# Patient Record
Sex: Female | Born: 1970 | Hispanic: Yes | Marital: Married | Smoking: Never smoker
Health system: Southern US, Community
[De-identification: ages and names within clinical notes are randomized; demographics above are authoritative.]

## PROBLEM LIST (undated history)

## (undated) DIAGNOSIS — I10 Essential (primary) hypertension: Secondary | ICD-10-CM

## (undated) DIAGNOSIS — M1990 Unspecified osteoarthritis, unspecified site: Secondary | ICD-10-CM

## (undated) HISTORY — PX: OTHER SURGICAL HISTORY: SHX169

---

## 2006-08-27 ENCOUNTER — Ambulatory Visit: Payer: Self-pay

## 2006-10-13 ENCOUNTER — Emergency Department: Payer: Self-pay | Admitting: Emergency Medicine

## 2007-02-17 ENCOUNTER — Ambulatory Visit: Payer: Self-pay | Admitting: Urology

## 2009-10-14 ENCOUNTER — Emergency Department: Payer: Self-pay | Admitting: Emergency Medicine

## 2009-10-16 ENCOUNTER — Emergency Department: Payer: Self-pay | Admitting: Internal Medicine

## 2009-12-25 ENCOUNTER — Emergency Department: Payer: Self-pay | Admitting: Unknown Physician Specialty

## 2011-08-30 ENCOUNTER — Emergency Department: Payer: Self-pay | Admitting: Unknown Physician Specialty

## 2011-08-30 LAB — COMPREHENSIVE METABOLIC PANEL
Albumin: 3.6 g/dL (ref 3.4–5.0)
Alkaline Phosphatase: 47 U/L — ABNORMAL LOW (ref 50–136)
Anion Gap: 10 (ref 7–16)
BUN: 11 mg/dL (ref 7–18)
Glucose: 113 mg/dL — ABNORMAL HIGH (ref 65–99)
Osmolality: 285 (ref 275–301)
Potassium: 3.8 mmol/L (ref 3.5–5.1)
SGOT(AST): 19 U/L (ref 15–37)
SGPT (ALT): 18 U/L
Sodium: 143 mmol/L (ref 136–145)
Total Protein: 7.6 g/dL (ref 6.4–8.2)

## 2011-08-30 LAB — CBC
HCT: 36 % (ref 35.0–47.0)
HGB: 12.2 g/dL (ref 12.0–16.0)
MCV: 93 fL (ref 80–100)
RDW: 12.8 % (ref 11.5–14.5)
WBC: 8 10*3/uL (ref 3.6–11.0)

## 2011-08-30 LAB — URINALYSIS, COMPLETE
Bacteria: NONE SEEN
Bilirubin,UR: NEGATIVE
Blood: NEGATIVE
Glucose,UR: NEGATIVE mg/dL (ref 0–75)
Ketone: NEGATIVE
Leukocyte Esterase: NEGATIVE
Nitrite: NEGATIVE
RBC,UR: NONE SEEN /HPF (ref 0–5)
Specific Gravity: 1.002 (ref 1.003–1.030)
WBC UR: <1 /HPF (ref 0–5)

## 2011-08-30 LAB — PREGNANCY, URINE: Pregnancy Test, Urine: NEGATIVE m[IU]/mL

## 2012-04-21 ENCOUNTER — Emergency Department: Payer: Self-pay | Admitting: Emergency Medicine

## 2012-04-21 LAB — COMPREHENSIVE METABOLIC PANEL
Alkaline Phosphatase: 56 U/L (ref 50–136)
BUN: 8 mg/dL (ref 7–18)
Bilirubin,Total: 0.2 mg/dL (ref 0.2–1.0)
Calcium, Total: 8.4 mg/dL — ABNORMAL LOW (ref 8.5–10.1)
Creatinine: 0.38 mg/dL — ABNORMAL LOW (ref 0.60–1.30)
EGFR (African American): >60
Glucose: 80 mg/dL (ref 65–99)
Potassium: 3.5 mmol/L (ref 3.5–5.1)
SGOT(AST): 16 U/L (ref 15–37)
SGPT (ALT): 16 U/L (ref 12–78)
Sodium: 140 mmol/L (ref 136–145)
Total Protein: 7.5 g/dL (ref 6.4–8.2)

## 2012-04-21 LAB — CBC
HCT: 34.5 % — ABNORMAL LOW (ref 35.0–47.0)
MCH: 32.2 pg (ref 26.0–34.0)
MCV: 95 fL (ref 80–100)
RDW: 13.1 % (ref 11.5–14.5)
WBC: 9 10*3/uL (ref 3.6–11.0)

## 2012-04-21 LAB — URINALYSIS, COMPLETE
Bacteria: NONE SEEN
Glucose,UR: NEGATIVE mg/dL (ref 0–75)
Leukocyte Esterase: NEGATIVE
Nitrite: NEGATIVE
Protein: NEGATIVE
RBC,UR: <1 /HPF (ref 0–5)
Specific Gravity: 1.006 (ref 1.003–1.030)
WBC UR: NONE SEEN /HPF (ref 0–5)

## 2012-11-26 DIAGNOSIS — R32 Unspecified urinary incontinence: Secondary | ICD-10-CM | POA: Insufficient documentation

## 2012-11-26 DIAGNOSIS — N200 Calculus of kidney: Secondary | ICD-10-CM | POA: Insufficient documentation

## 2012-11-26 DIAGNOSIS — Z87442 Personal history of urinary calculi: Secondary | ICD-10-CM | POA: Insufficient documentation

## 2012-12-10 DIAGNOSIS — R3915 Urgency of urination: Secondary | ICD-10-CM | POA: Insufficient documentation

## 2014-05-25 ENCOUNTER — Emergency Department: Payer: Self-pay | Admitting: Emergency Medicine

## 2015-10-29 ENCOUNTER — Encounter: Payer: Self-pay | Admitting: Emergency Medicine

## 2015-10-29 ENCOUNTER — Emergency Department
Admission: EM | Admit: 2015-10-29 | Discharge: 2015-10-29 | Disposition: A | Discharge status: Home / Self Care | Payer: No Typology Code available for payment source | Attending: Emergency Medicine | Admitting: Emergency Medicine

## 2015-10-29 DIAGNOSIS — Z3202 Encounter for pregnancy test, result negative: Secondary | ICD-10-CM | POA: Insufficient documentation

## 2015-10-29 DIAGNOSIS — R197 Diarrhea, unspecified: Secondary | ICD-10-CM | POA: Diagnosis present

## 2015-10-29 DIAGNOSIS — K529 Noninfective gastroenteritis and colitis, unspecified: Secondary | ICD-10-CM | POA: Insufficient documentation

## 2015-10-29 LAB — URINALYSIS COMPLETE WITH MICROSCOPIC (ARMC ONLY)
Bilirubin Urine: NEGATIVE
GLUCOSE, UA: NEGATIVE mg/dL
LEUKOCYTES UA: NEGATIVE
NITRITE: NEGATIVE
Protein, ur: 30 mg/dL — AB
SPECIFIC GRAVITY, URINE: 1.024 (ref 1.005–1.030)
pH: 5 (ref 5.0–8.0)

## 2015-10-29 LAB — COMPREHENSIVE METABOLIC PANEL
ALK PHOS: 67 U/L (ref 38–126)
ALT: 42 U/L (ref 14–54)
ANION GAP: 9 (ref 5–15)
AST: 40 U/L (ref 15–41)
Albumin: 4.3 g/dL (ref 3.5–5.0)
BILIRUBIN TOTAL: 0.7 mg/dL (ref 0.3–1.2)
BUN: 19 mg/dL (ref 6–20)
CALCIUM: 8.7 mg/dL — AB (ref 8.9–10.3)
CO2: 20 mmol/L — ABNORMAL LOW (ref 22–32)
Chloride: 105 mmol/L (ref 101–111)
Creatinine, Ser: 0.47 mg/dL (ref 0.44–1.00)
GFR calc Af Amer: >60 mL/min (ref >60)
Glucose, Bld: 140 mg/dL — ABNORMAL HIGH (ref 65–99)
POTASSIUM: 3.1 mmol/L — AB (ref 3.5–5.1)
Sodium: 134 mmol/L — ABNORMAL LOW (ref 135–145)
TOTAL PROTEIN: 7.8 g/dL (ref 6.5–8.1)

## 2015-10-29 LAB — LIPASE, BLOOD: LIPASE: 16 U/L (ref 11–51)

## 2015-10-29 LAB — CBC
HEMATOCRIT: 37.3 % (ref 35.0–47.0)
HEMOGLOBIN: 12.6 g/dL (ref 12.0–16.0)
MCH: 31.1 pg (ref 26.0–34.0)
MCHC: 33.7 g/dL (ref 32.0–36.0)
MCV: 92.5 fL (ref 80.0–100.0)
Platelets: 200 10*3/uL (ref 150–440)
RBC: 4.03 MIL/uL (ref 3.80–5.20)
RDW: 12.9 % (ref 11.5–14.5)
WBC: 9.8 10*3/uL (ref 3.6–11.0)

## 2015-10-29 LAB — POCT PREGNANCY, URINE: PREG TEST UR: NEGATIVE

## 2015-10-29 MED ORDER — SODIUM CHLORIDE 0.9 % IV BOLUS (SEPSIS)
1000.0000 mL | Freq: Once | INTRAVENOUS | Status: AC
  Administered 2015-10-29: 1000 mL via INTRAVENOUS

## 2015-10-29 MED ORDER — PROMETHAZINE HCL 12.5 MG PO TABS: 12.5000 mg | ORAL_TABLET | Freq: Four times a day (QID) | ORAL | Status: AC | PRN

## 2015-10-29 MED ORDER — PROMETHAZINE HCL 25 MG/ML IJ SOLN
12.5000 mg | Freq: Four times a day (QID) | INTRAMUSCULAR | Status: DC | PRN
  Administered 2015-10-29: 12.5 mg via INTRAVENOUS
  Filled 2015-10-29: qty 1

## 2015-10-29 MED ORDER — POTASSIUM CHLORIDE CRYS ER 20 MEQ PO TBCR
40.0000 meq | EXTENDED_RELEASE_TABLET | Freq: Once | ORAL | Status: AC
  Administered 2015-10-29: 40 meq via ORAL
  Filled 2015-10-29: qty 2

## 2015-10-29 NOTE — ED Provider Notes (Addendum)
[facility] Emergency Department Provider Note  ____________________________________________   I have reviewed the triage vital signs and the nursing notes.   HISTORY  Chief Complaint Abdominal Pain; Emesis; and Diarrhea    HPI Donna Villarreal is a 45 y.o. female who is largely healthy. States that she has had nausea vomiting and diarrhea since yesterday. No fever no chills. No shortness of breath. No hematemesis no red blood per rectum. Patient states she has had more watery diarrheas and she can. No recent antibiotics. No recent travel. Her son has the same thing.  History reviewed. No pertinent past medical history.  There are no active problems to display for this patient.   Past Surgical History  Procedure Laterality Date  . Cesarean section      No current outpatient prescriptions on file.  Allergies Review of patient's allergies indicates no known allergies.  History reviewed. No pertinent family history.  Social History Social History  Substance Use Topics  . Smoking status: Never Smoker   . Smokeless tobacco: None  . Alcohol Use: No    Review of Systems Constitutional: No fever/chills Eyes: No visual changes. ENT: No sore throat. No stiff neck no neck pain Cardiovascular: Denies chest pain. Respiratory: Denies shortness of breath. Gastrointestinal:   See history of present illness Genitourinary: Negative for dysuria. Musculoskeletal: Negative lower extremity swelling Skin: Negative for rash. Neurological: Negative for headaches, focal weakness or numbness. 10-point ROS otherwise negative.  ____________________________________________   PHYSICAL EXAM:  VITAL SIGNS: ED Triage Vitals  Enc Vitals Group     BP 10/29/15 0019 108/73 mmHg     Pulse Rate 10/29/15 0019 97     Resp 10/29/15 0019 20     Temp 10/29/15 0341 98.3 F (36.8 C)     Temp Source 10/29/15 0341 Oral     SpO2 10/29/15 0019 99 %   Weight 10/29/15 0019 149 lb (67.586 kg)     Height 10/29/15 0019 5' (1.524 m)     Head Cir --      Peak Flow --      Pain Score 10/29/15 0021 6     Pain Loc --      Pain Edu? --      Excl. in GC? --     Constitutional: Alert and oriented. Well appearing and in no acute distress. Eyes: Conjunctivae are normal. PERRL. EOMI. Head: Atraumatic. Nose: No congestion/rhinnorhea. Mouth/Throat: Mucous membranes are moist.  Oropharynx non-erythematous. Neck: No stridor.   Nontender with no meningismus Cardiovascular: Normal rate, regular rhythm. Grossly normal heart sounds.  Good peripheral circulation. Respiratory: Normal respiratory effort.  No retractions. Lungs CTAB. Abdominal: Soft and nontender. No distention. No guarding no rebound Back:  There is no focal tenderness or step off there is no midline tenderness there are no lesions noted. there is no CVA tenderness Musculoskeletal: No lower extremity tenderness. No joint effusions, no DVT signs strong distal pulses no edema Neurologic:  Normal speech and language. No gross focal neurologic deficits are appreciated.  Skin:  Skin is warm, dry and intact. No rash noted. Psychiatric: Mood and affect are normal. Speech and behavior are normal.  ____________________________________________   LABS (all labs ordered are listed, but only abnormal results are displayed)  Labs Reviewed  COMPREHENSIVE METABOLIC PANEL - Abnormal; Notable for the following:    Sodium 134 (*)    Potassium 3.1 (*)    CO2 20 (*)    Glucose, Bld 140 (*)  Calcium 8.7 (*)    All other components within normal limits  URINALYSIS COMPLETEWITH MICROSCOPIC ([facility] ONLY) - Abnormal; Notable for the following:    Color, Urine YELLOW (*)    APPearance CLEAR (*)    Ketones, ur TRACE (*)    Hgb urine dipstick 3+ (*)    Protein, ur 30 (*)    Bacteria, UA RARE (*)    Squamous Epithelial / LPF 0-5 (*)    All other components within normal limits  LIPASE, BLOOD  CBC  POC  URINE PREG, ED  POCT PREGNANCY, URINE   ____________________________________________  EKG  I personally interpreted any EKGs ordered by me or triage  ____________________________________________  RADIOLOGY  I reviewed any imaging ordered by me or triage that were performed during my shift and, if possible, patient and/or family made aware of any abnormal findings. ____________________________________________   PROCEDURES  Procedure(s) performed: None  Critical Care performed: None  ____________________________________________   INITIAL IMPRESSION / ASSESSMENT AND PLAN / ED COURSE  Pertinent labs & imaging results that were available during my care of the patient were reviewed by me and considered in my medical decision making (see chart for details).  Patient with likely gastroenteritis. Abdomen is benign. We will replete her potassium blood work is otherwise largely reassuring. Serial abdominal exams show no evidence of surgical pathology. We'll try by mouth challenge.  ----------------------------------------- 6:19 AM on 10/29/2015 -----------------------------------------  Patient states she feels much better serial abdominal exams are completely benign, patient is eager to go home. Follow-up with PCP as advised. Return precautions given and understood. ____________________________________________   FINAL CLINICAL IMPRESSION(S) / ED DIAGNOSES  Final diagnoses:  None      This chart was dictated using voice recognition software.  Despite best efforts to proofread,  errors can occur which can change meaning.     Jeanmarie PlantJames A Nanami Whitelaw, MD 10/29/15 [id number]  Jeanmarie PlantJames A Mearle Drew, MD 10/29/15 [phone]

## 2015-10-29 NOTE — ED Notes (Signed)
Waiting for interpreter to assess pt; currently busy with another pt in dept

## 2015-10-29 NOTE — ED Notes (Signed)
Interpreter present

## 2015-10-29 NOTE — ED Notes (Signed)
Patient reports have symptoms of a virus for approximately 1 month.  Around 1pm Saturday started with abdominal pain, nausea, vomiting and diarreha. Also reports short of breath.  Patient with skin warm, pale in color.  Respirations even and unlabored, no acute respiratory distress noted.

## 2016-06-17 ENCOUNTER — Ambulatory Visit: Payer: Managed Care, Other (non HMO) | Attending: Oncology | Admitting: *Deleted

## 2016-06-17 ENCOUNTER — Ambulatory Visit
Admission: RE | Admit: 2016-06-17 | Discharge: 2016-06-17 | Disposition: A | Discharge status: Home / Self Care | Payer: Managed Care, Other (non HMO) | Source: Ambulatory Visit | Attending: Oncology | Admitting: Oncology

## 2016-06-17 VITALS — BP 115/78 | HR 68 | Temp 98.2°F | Ht 59.45 in | Wt 140.1 lb

## 2016-06-17 DIAGNOSIS — Z0000 Encounter for general adult medical examination without abnormal findings: Secondary | ICD-10-CM

## 2016-06-17 NOTE — Patient Instructions (Signed)
Gave patient hand-out, Women Staying Healthy, Active and Well from [facility], with education on breast health, pap smears, heart and colon health. 

## 2016-06-17 NOTE — Progress Notes (Signed)
Subjective:     Patient ID: Donna Villarreal, female   DOB: October 26, 1970, 45 y.o.   MRN: [id number]  HPI   Review of Systems     Objective:   Physical Exam  Pulmonary/Chest: Right breast exhibits no inverted nipple, no mass, no nipple discharge, no skin change and no tenderness. Left breast exhibits no inverted nipple, no mass, no nipple discharge, no skin change and no tenderness. Breasts are symmetrical.       Assessment:     45 year old Hispanic [facility]  Lloyda, the interpreter present during the interview and exam.  Clinical breast exam unremarkable.  Taught self breast awareness.  Patient states she has a little "ball" in the upper left abdomen.  When she stands up there is a small approximate 1 cm smooth nodule in the left upper quadrant near the rib cage.  Encouraged her to discuss with [facility].  She is agreeable.    Plan:     Screening mammogram ordered.  Will follow-up per [facility] protocol.

## 2016-06-18 ENCOUNTER — Encounter: Payer: Self-pay | Admitting: *Deleted

## 2016-06-18 NOTE — Progress Notes (Signed)
Letter mailed to inform patient of her normal mammogram.  She is to follow-up in one year with annual screening.  HSIS to Christy. 

## 2017-08-29 ENCOUNTER — Other Ambulatory Visit: Payer: Self-pay | Admitting: Physician Assistant

## 2017-08-29 DIAGNOSIS — R1032 Left lower quadrant pain: Secondary | ICD-10-CM

## 2017-09-09 ENCOUNTER — Ambulatory Visit
Admission: RE | Admit: 2017-09-09 | Discharge: 2017-09-09 | Disposition: A | Discharge status: Home / Self Care | Payer: Managed Care, Other (non HMO) | Source: Ambulatory Visit | Attending: Physician Assistant | Admitting: Physician Assistant

## 2017-09-11 ENCOUNTER — Ambulatory Visit
Admission: RE | Admit: 2017-09-11 | Discharge: 2017-09-11 | Disposition: A | Discharge status: Home / Self Care | Payer: 59 | Source: Ambulatory Visit | Attending: Physician Assistant | Admitting: Physician Assistant

## 2017-09-11 DIAGNOSIS — G8929 Other chronic pain: Secondary | ICD-10-CM | POA: Diagnosis not present

## 2017-09-11 DIAGNOSIS — R1032 Left lower quadrant pain: Secondary | ICD-10-CM | POA: Insufficient documentation

## 2017-09-11 DIAGNOSIS — N200 Calculus of kidney: Secondary | ICD-10-CM | POA: Insufficient documentation

## 2017-09-16 ENCOUNTER — Encounter: Payer: Self-pay | Admitting: Emergency Medicine

## 2017-09-16 ENCOUNTER — Emergency Department
Admission: EM | Admit: 2017-09-16 | Discharge: 2017-09-16 | Disposition: A | Discharge status: Home / Self Care | Payer: 59 | Attending: Emergency Medicine | Admitting: Emergency Medicine

## 2017-09-16 ENCOUNTER — Other Ambulatory Visit: Payer: Self-pay

## 2017-09-16 DIAGNOSIS — R109 Unspecified abdominal pain: Secondary | ICD-10-CM

## 2017-09-16 DIAGNOSIS — R1012 Left upper quadrant pain: Secondary | ICD-10-CM | POA: Insufficient documentation

## 2017-09-16 DIAGNOSIS — R112 Nausea with vomiting, unspecified: Secondary | ICD-10-CM | POA: Insufficient documentation

## 2017-09-16 LAB — COMPREHENSIVE METABOLIC PANEL
ALK PHOS: 57 U/L (ref 38–126)
ALT: 20 U/L (ref 14–54)
AST: 21 U/L (ref 15–41)
Albumin: 4 g/dL (ref 3.5–5.0)
Anion gap: 10 (ref 5–15)
BILIRUBIN TOTAL: 0.5 mg/dL (ref 0.3–1.2)
BUN: 12 mg/dL (ref 6–20)
CALCIUM: 9.3 mg/dL (ref 8.9–10.3)
CHLORIDE: 102 mmol/L (ref 101–111)
CO2: 26 mmol/L (ref 22–32)
CREATININE: 0.4 mg/dL — AB (ref 0.44–1.00)
Glucose, Bld: 91 mg/dL (ref 65–99)
Potassium: 3.5 mmol/L (ref 3.5–5.1)
Sodium: 138 mmol/L (ref 135–145)
Total Protein: 7.6 g/dL (ref 6.5–8.1)

## 2017-09-16 LAB — CBC
HCT: 33.1 % — ABNORMAL LOW (ref 35.0–47.0)
Hemoglobin: 11.5 g/dL — ABNORMAL LOW (ref 12.0–16.0)
MCH: 31.4 pg (ref 26.0–34.0)
MCHC: 34.6 g/dL (ref 32.0–36.0)
MCV: 90.8 fL (ref 80.0–100.0)
PLATELETS: 179 10*3/uL (ref 150–440)
RBC: 3.65 MIL/uL — ABNORMAL LOW (ref 3.80–5.20)
RDW: 13 % (ref 11.5–14.5)
WBC: 10.3 10*3/uL (ref 3.6–11.0)

## 2017-09-16 LAB — URINALYSIS, COMPLETE (UACMP) WITH MICROSCOPIC
Bacteria, UA: NONE SEEN
Bilirubin Urine: NEGATIVE
GLUCOSE, UA: NEGATIVE mg/dL
KETONES UR: NEGATIVE mg/dL
LEUKOCYTES UA: NEGATIVE
Nitrite: NEGATIVE
PH: 7 (ref 5.0–8.0)
Protein, ur: NEGATIVE mg/dL
Specific Gravity, Urine: 1.004 — ABNORMAL LOW (ref 1.005–1.030)

## 2017-09-16 LAB — LIPASE, BLOOD: LIPASE: 26 U/L (ref 11–51)

## 2017-09-16 MED ORDER — ONDANSETRON 4 MG PO TBDP: 4.0000 mg | ORAL_TABLET | Freq: Three times a day (TID) | ORAL | 0 refills | Status: DC | PRN

## 2017-09-16 MED ORDER — DICYCLOMINE HCL 20 MG PO TABS: 20.0000 mg | ORAL_TABLET | Freq: Three times a day (TID) | ORAL | 0 refills | Status: DC | PRN

## 2017-09-16 MED ORDER — GI COCKTAIL ~~LOC~~
30.0000 mL | Freq: Once | ORAL | Status: AC
  Administered 2017-09-16: 30 mL via ORAL
  Filled 2017-09-16: qty 30

## 2017-09-16 MED ORDER — DICYCLOMINE HCL 10 MG PO CAPS
20.0000 mg | ORAL_CAPSULE | Freq: Once | ORAL | Status: AC
  Administered 2017-09-16: 20 mg via ORAL
  Filled 2017-09-16: qty 2

## 2017-09-16 MED ORDER — ONDANSETRON 4 MG PO TBDP
ORAL_TABLET | ORAL | Status: AC
  Filled 2017-09-16: qty 1

## 2017-09-16 MED ORDER — ONDANSETRON 4 MG PO TBDP
4.0000 mg | ORAL_TABLET | Freq: Once | ORAL | Status: AC
  Administered 2017-09-16: 4 mg via ORAL

## 2017-09-16 NOTE — ED Notes (Signed)
First nurse note  [facility]   Pain started 2 months ago  Pain increased over the past 2 days

## 2017-09-16 NOTE — ED Provider Notes (Signed)
[facility] Emergency Department Provider Note  Time seen: 5:48 PM  I have reviewed the triage vital signs and the nursing notes.   HISTORY  Chief Complaint Abdominal Pain    HPI Donna Villarreal is a 47 y.o. female who presents to the emergency department for left upper quadrant pain times 2 months.  According to the patient for the past 2 months she has been expensing left upper quadrant pain, significantly worse over the past 24 hours.  States nausea with occasional episodes of vomiting, had one episode of vomiting with blood streaking in it.  Denies any diarrhea, black or bloody stool.  Denies dysuria or hematuria.  No known fever.  Patient states she is seen her doctor for the same, had a CT scan ordered several days ago but has not heard the results yet.  Describes her pain currently is moderate to severe in the left upper quadrant burning in quality.   History reviewed. No pertinent past medical history.  There are no active problems to display for this patient.   Past Surgical History:  Procedure Laterality Date  . CESAREAN SECTION      Prior to Admission medications   Medication Sig Start Date End Date Taking? Authorizing Provider  promethazine (PHENERGAN) 12.5 MG tablet Take 1 tablet (12.5 mg total) by mouth every 6 (six) hours as needed for nausea or vomiting. 10/29/15   Jeanmarie PlantMcShane, James A, MD    No Known Allergies  No family history on file.  Social History Social History   Tobacco Use  . Smoking status: Never Smoker  . Smokeless tobacco: Never Used  Substance Use Topics  . Alcohol use: No  . Drug use: Not on file    Review of Systems Constitutional: Negative for fever. Eyes: Negative for visual complaints ENT: Negative for recent illness/congestion Cardiovascular: Negative for chest pain. Respiratory: Negative for shortness of breath. Gastrointestinal: Left mid and upper abdominal pain, burning.  Nausea and vomiting.   Negative for diarrhea. Genitourinary: Negative for hematuria or dysuria. Musculoskeletal: Negative for musculoskeletal complaints Skin: Negative for skin complaints  Neurological: Negative for headache All other ROS negative  ____________________________________________   PHYSICAL EXAM:  VITAL SIGNS: ED Triage Vitals  Enc Vitals Group     BP 09/16/17 1534 133/84     Pulse Rate 09/16/17 1534 77     Resp 09/16/17 1534 17     Temp 09/16/17 1534 97.9 F (36.6 C)     Temp Source 09/16/17 1534 Oral     SpO2 09/16/17 1534 100 %     Weight 09/16/17 1535 145 lb (65.8 kg)     Height 09/16/17 1535 5\' 2"  (1.575 m)     Head Circumference --      Peak Flow --      Pain Score 09/16/17 1534 5     Pain Loc --      Pain Edu? --      Excl. in GC? --    Constitutional: Alert and oriented. Well appearing and in no distress. Eyes: Normal exam ENT   Head: Normocephalic and atraumatic.   Mouth/Throat: Mucous membranes are moist. Cardiovascular: Normal rate, regular rhythm. No murmur Respiratory: Normal respiratory effort without tachypnea nor retractions. Breath sounds are clear  Gastrointestinal: Left upper quadrant abdominal tenderness to palpation.  No rebound or guarding.  No distention, mild epigastric tenderness, mild left lower quadrant tenderness. Musculoskeletal: Nontender with normal range of motion in all extremities.  Neurologic:  Normal speech and language.  No gross focal neurologic deficits  Skin:  Skin is warm, dry and intact.  Psychiatric: Mood and affect are normal.   ____________________________________________   INITIAL IMPRESSION / ASSESSMENT AND PLAN / ED COURSE  Pertinent labs & imaging results that were available during my care of the patient were reviewed by me and considered in my medical decision making (see chart for details).  Patient presents to the emergency department for left upper quadrant abdominal tenderness and burning over the past 2 months worse  over the past 24 hours.  Differential would include gastritis, pancreatitis, less likely gallbladder disease, colitis or diverticulitis.  I reviewed the patient's recent CT scan which overall looks normal, no acute findings, no signs of a kidney stone.  Patient's labs today are largely within normal limits including LFTs and lipase.  Patient's vitals are normal.  We will try a GI cocktail for symptom relief and continue to closely monitor.  Patient gagged instantly upon attempted swallowing of the GI cocktail, and vomited.  We will dose Zofran as well as Bentyl and continue to closely monitor.  With the patient's recent normal CT scan normal labs and pain ongoing for 2 months I am not entirely sure of the cause of the patient's pain, however I do not believe further CT imaging would be of much utility at this point, I believe the patient would best be served by GI follow-up.  Just with Spanish interpreter.  Patient states she is feeling much better.  Agreeable to plan of care and follow-up with GI medicine.  ____________________________________________   FINAL CLINICAL IMPRESSION(S) / ED DIAGNOSES  Upper abdominal pain    Minna Antis, MD 09/16/17 1904

## 2017-09-16 NOTE — ED Notes (Signed)
Pt presents today with ABD pain, pt daughter states pt had similar episode 2 mths ago went to [facility] had scans done pt never followed up. Pt daughter states pt has had L/S abd pain with flank pain and throat hurting for 2 days now. "Pt had an episode of vomiting with streaks of blood" states daugther. Pt is awaiting EDP.

## 2017-09-16 NOTE — ED Triage Notes (Addendum)
Pt c/o LUQ pain that radiates around to the back with nausea. Denies vomiting or diarrhea. States the pain has been going for several months and had CT abd this past Thursday but did not give her results.

## 2017-09-16 NOTE — ED Notes (Signed)
Pt ambulatory to POV without difficulty. VSS. NAD. Discharge instructions, RX and follow up discussed. All questions answered.  

## 2017-10-07 ENCOUNTER — Encounter: Payer: Self-pay | Admitting: Gastroenterology

## 2017-10-07 ENCOUNTER — Ambulatory Visit: Payer: 59 | Admitting: Gastroenterology

## 2017-10-07 ENCOUNTER — Other Ambulatory Visit: Payer: Self-pay

## 2017-10-07 VITALS — BP 130/84 | HR 71 | Temp 98.2°F | Ht 62.0 in | Wt 134.0 lb

## 2017-10-07 DIAGNOSIS — R1013 Epigastric pain: Secondary | ICD-10-CM | POA: Diagnosis not present

## 2017-10-07 DIAGNOSIS — R1012 Left upper quadrant pain: Secondary | ICD-10-CM

## 2017-10-07 DIAGNOSIS — K920 Hematemesis: Secondary | ICD-10-CM

## 2017-10-07 NOTE — Progress Notes (Signed)
Gastroenterology Consultation  Referring Provider:     [facility] Primary Care Physician:  [facility] Primary Gastroenterologist:  Dr. Servando Snare     Reason for Consultation:     Dyspepsia and abdominal pain        HPI:   Donna Villarreal is a 47 y.o. y/o female referred for consultation & management of Dyspepsia and abdominal pain by Dr. Eli Phillips, [facility].  This patient comes in today with a history of abdominal pain for the last 2 months.  The patient states that her abdominal pain is in her left side of her abdomen in the mid abdomen and radiates around to her back.  The patient reports that the pain is worse when she stands up for to long the time of when she bends over.  The characteristics of the pain is a burning type pain.  The patient also reports that the pain causes her to feel nauseous.  There is no report of any black stools or bloody stools she also denies any change in bowel habits but has always been constipated.  There is no report of any family history of colon cancer or colon polyps.  The patient also states that she feels a little hard ball like structure in the left area of her abdomen.  She does report having upper endoscopy approximately 8 years ago at [facility] for abdominal pain.  There is no report of any dysphagia. The patient's history is through an interpreter since the patient does not speak [place].  History reviewed. No pertinent past medical history.  Past Surgical History:  Procedure Laterality Date  . CESAREAN SECTION      Prior to Admission medications   Medication Sig Start Date End Date Taking? Authorizing Provider  cetirizine (ZYRTEC) 10 MG tablet Take 10 mg by mouth daily.   Yes [provider]  dicyclomine (BENTYL) 20 MG tablet Take 1 tablet (20 mg total) by mouth 3 (three) times daily as needed for spasms. 09/16/17 09/16/18 Yes Minna Antis, MD  acetaminophen (TYLENOL) 500 MG tablet  Take 1,000 mg by mouth. 11/19/13   [provider]  albuterol (PROVENTIL HFA) 108 (90 Base) MCG/ACT inhaler 2 puffs q.i.d. p.r.n. short of breath, wheezing, or cough 06/20/17   [provider]  azelastine (OPTIVAR) 0.05 % ophthalmic solution  04/18/17   [provider]  cyclobenzaprine (FLEXERIL) 5 MG tablet Take 5 mg by mouth. 11/19/13   [provider]  DULoxetine (CYMBALTA) 30 MG capsule  08/26/17   [provider]  fexofenadine (ALLEGRA) 180 MG tablet Take 180 mg by mouth.    [provider]  fluticasone (FLONASE) 50 MCG/ACT nasal spray Place into the nose. 07/29/17   [provider]  Olopatadine HCl 0.2 % SOLN  09/23/17   [provider]  omeprazole (PRILOSEC) 20 MG capsule  08/26/17   [provider]  ondansetron (ZOFRAN ODT) 4 MG disintegrating tablet Take 1 tablet (4 mg total) by mouth every 8 (eight) hours as needed for nausea or vomiting. Patient not taking: Reported on 10/07/2017 09/16/17   Minna Antis, MD  oxybutynin (DITROPAN-XL) 10 MG 24 hr tablet Take 10 mg by mouth. 12/10/12   [provider]  promethazine (PHENERGAN) 12.5 MG tablet Take 1 tablet (12.5 mg total) by mouth every 6 (six) hours as needed for nausea or vomiting. Patient not taking: Reported on 10/07/2017 10/29/15   Jeanmarie Plant, MD  ROBAFEN AC 100-10  MG/5ML syrup  07/29/17   [provider]    History reviewed. No pertinent family history.   Social History   Tobacco Use  . Smoking status: Never Smoker  . Smokeless tobacco: Never Used  Substance Use Topics  . Alcohol use: No  . Drug use: No    Allergies as of 10/07/2017  . (No Known Allergies)    Review of Systems:    All systems reviewed and negative except where noted in HPI.   Physical Exam:  BP 130/84   Pulse 71   Temp 98.2 F (36.8 C) (Oral)   Ht 5\' 2"  (1.575 m)   Wt 134 lb (60.8 kg)   LMP 09/12/2017 (Exact Date)   BMI 24.51 kg/m  Patient's last  menstrual period was 09/12/2017 (exact date). Psych:  Alert and cooperative. Normal mood and affect. General:   Alert,  Well-developed, well-nourished, pleasant and cooperative in NAD Head:  Normocephalic and atraumatic. Eyes:  Sclera clear, no icterus.   Conjunctiva pink. Ears:  Normal auditory acuity. Nose:  No deformity, discharge, or lesions. Mouth:  No deformity or lesions,oropharynx pink & moist. Neck:  Supple; no masses or thyromegaly. Lungs:  Respirations even and unlabored.  Clear throughout to auscultation.   No wheezes, crackles, or rhonchi. No acute distress. Heart:  Regular rate and rhythm; no murmurs, clicks, rubs, or gallops. Abdomen:  Normal bowel sounds.  No bruits.  Soft, Positive tenderness to 1 finger palpation while flexing the abdominal wall muscles and non-distended without masses, hepatosplenomegaly or hernias noted.  No guarding or rebound tenderness.  Positive Carnett sign.   Rectal:  Deferred.  Msk:  Symmetrical without gross deformities.  Good, equal movement & strength bilaterally. Pulses:  Normal pulses noted. Extremities:  No clubbing or edema.  No cyanosis. Neurologic:  Alert and oriented x3;  grossly normal neurologically. Skin:  Intact without significant lesions or rashes.  No jaundice. Lymph Nodes:  No significant cervical adenopathy. Psych:  Alert and cooperative. Normal mood and affect.  Imaging Studies: Ct Renal Stone Study  Result Date: 09/11/2017 CLINICAL DATA:  LLQ pain running across abdomen, x66mos, nausea and constipation, pt states she has chills but denies fever, hx c-section, denies chance of pregnancy, LMP now. EXAM: CT ABDOMEN AND PELVIS WITHOUT CONTRAST TECHNIQUE: Multidetector CT imaging of the abdomen and pelvis was performed following the standard protocol without IV contrast. COMPARISON:  04/21/2012 FINDINGS: Lower chest: No acute abnormality. Hepatobiliary: No focal liver abnormality is seen. No gallstones, gallbladder wall thickening, or  biliary dilatation. Pancreas: Unremarkable. No pancreatic ductal dilatation or surrounding inflammatory changes. Spleen: Normal in size without focal abnormality. Adrenals/Urinary Tract: Normal adrenal glands. No hydronephrosis or ureterectasis. 0.1 cm mid right renal collecting system calculus. 0.2 cm calcification in the lower pole left kidney as before. Urinary bladder incompletely distended. Stomach/Bowel: Stomach and small bowel are decompressed. Normal appendix. Colon is nondilated, unremarkable. Vascular/Lymphatic: No significant vascular findings are present. No enlarged abdominal or pelvic lymph nodes. Reproductive: Uterus and bilateral adnexa are unremarkable. Other: No ascites.  No free air. Musculoskeletal: Anterior vertebral endplate spurring at multiple levels in the lower thoracic spine. No fracture or worrisome bone lesion. IMPRESSION: 1. No acute findings. 2. Stable bilateral nephrolithiasis without hydronephrosis. Electronically Signed   By: Corlis Leak M.D.   On: 09/11/2017 10:23    Assessment and Plan:   Donna Villarreal is a 47 y.o. y/o female who comes in today with fullness in her throat and dyspepsia.  The patient also has  abdominal pain that is clearly musculoskeletal in nature.  The patient has been feeling better with warm compresses to the abdomen and back wear her pain radiates and has been told to continue this.  She has also been told to take anti-inflammatory medication with food to help with the muscle inflammation.  She will be set up for an EGD because of her dyspepsia.I have discussed risks & benefits which include, but are not limited to, bleeding, infection, perforation & drug reaction.  The patient agrees with this plan & written consent will be obtained.     Donna Miniumarren Cloee Dunwoody, MD. Clementeen GrahamFACG   Note: This dictation was prepared with Dragon dictation along with smaller phrase technology. Any transcriptional errors that result from this process are unintentional.

## 2017-10-14 ENCOUNTER — Ambulatory Visit: Payer: No Typology Code available for payment source | Admitting: Anesthesiology

## 2017-10-14 ENCOUNTER — Other Ambulatory Visit: Payer: Self-pay

## 2017-10-14 ENCOUNTER — Encounter: Payer: Self-pay | Admitting: *Deleted

## 2017-10-14 ENCOUNTER — Encounter
Admission: RE | Disposition: A | Discharge status: Home / Self Care | Payer: Self-pay | Source: Ambulatory Visit | Attending: Gastroenterology

## 2017-10-14 ENCOUNTER — Ambulatory Visit
Admission: RE | Admit: 2017-10-14 | Discharge: 2017-10-14 | Disposition: A | Discharge status: Home / Self Care | Payer: No Typology Code available for payment source | Source: Ambulatory Visit | Attending: Gastroenterology | Admitting: Gastroenterology

## 2017-10-14 DIAGNOSIS — R1013 Epigastric pain: Secondary | ICD-10-CM | POA: Diagnosis not present

## 2017-10-14 DIAGNOSIS — K2950 Unspecified chronic gastritis without bleeding: Secondary | ICD-10-CM | POA: Insufficient documentation

## 2017-10-14 DIAGNOSIS — Z79899 Other long term (current) drug therapy: Secondary | ICD-10-CM | POA: Insufficient documentation

## 2017-10-14 DIAGNOSIS — K920 Hematemesis: Secondary | ICD-10-CM

## 2017-10-14 HISTORY — PX: ESOPHAGOGASTRODUODENOSCOPY (EGD) WITH PROPOFOL: SHX5813

## 2017-10-14 SURGERY — ESOPHAGOGASTRODUODENOSCOPY (EGD) WITH PROPOFOL
Anesthesia: General

## 2017-10-14 MED ORDER — PROPOFOL 10 MG/ML IV BOLUS
INTRAVENOUS | Status: DC | PRN
  Administered 2017-10-14: 30 mg via INTRAVENOUS
  Administered 2017-10-14: 60 mg via INTRAVENOUS

## 2017-10-14 MED ORDER — SODIUM CHLORIDE 0.9 % IV SOLN
INTRAVENOUS | Status: DC
  Administered 2017-10-14: 10:00:00 via INTRAVENOUS

## 2017-10-14 MED ORDER — LIDOCAINE HCL (PF) 2 % IJ SOLN
INTRAMUSCULAR | Status: AC
  Filled 2017-10-14: qty 10

## 2017-10-14 MED ORDER — LIDOCAINE HCL (CARDIAC) 20 MG/ML IV SOLN
INTRAVENOUS | Status: DC | PRN
  Administered 2017-10-14: 100 mg via INTRAVENOUS

## 2017-10-14 MED ORDER — PROPOFOL 500 MG/50ML IV EMUL
INTRAVENOUS | Status: AC
  Filled 2017-10-14: qty 50

## 2017-10-14 NOTE — H&P (Signed)
Donna Miniumarren Adalis Gatti, MD [facility] [address] [place], [place] [address] Phone:[phone] Fax : [phone]  Primary Care Physician:  [facility] Primary Gastroenterologist:  Dr. Servando SnareWohl  Pre-Procedure History & Physical: HPI:  Donna NakayamaMaria C Villarreal is a 47 y.o. female is here for an endoscopy.   History reviewed. No pertinent past medical history.  Past Surgical History:  Procedure Laterality Date  . CESAREAN SECTION      Prior to Admission medications   Medication Sig Start Date End Date Taking? Authorizing Provider  acetaminophen (TYLENOL) 500 MG tablet Take 1,000 mg by mouth. 11/19/13  Yes [provider]  azelastine (OPTIVAR) 0.05 % ophthalmic solution  04/18/17  Yes [provider]  fluticasone (FLONASE) 50 MCG/ACT nasal spray Place into the nose. 07/29/17  Yes [provider]  Olopatadine HCl 0.2 % SOLN  09/23/17  Yes [provider]  oxybutynin (DITROPAN-XL) 10 MG 24 hr tablet Take 10 mg by mouth. 12/10/12  Yes [provider]  promethazine (PHENERGAN) 12.5 MG tablet Take 1 tablet (12.5 mg total) by mouth every 6 (six) hours as needed for nausea or vomiting. 10/29/15  Yes Jeanmarie PlantMcShane, James A, MD  ROBAFEN AC 100-10 MG/5ML syrup  07/29/17  Yes [provider]  albuterol (PROVENTIL HFA) 108 (90 Base) MCG/ACT inhaler 2 puffs q.i.d. p.r.n. short of breath, wheezing, or cough 06/20/17   [provider]  cetirizine (ZYRTEC) 10 MG tablet Take 10 mg by mouth daily.    [provider]  cyclobenzaprine (FLEXERIL) 5 MG tablet Take 5 mg by mouth. 11/19/13   [provider]  dicyclomine (BENTYL) 20 MG tablet Take 1 tablet (20 mg total) by mouth 3 (three) times daily as needed for spasms. Patient not taking: Reported on 10/14/2017 09/16/17 09/16/18  Minna AntisPaduchowski, Kevin, MD  DULoxetine (CYMBALTA) 30 MG capsule  08/26/17   [provider]  fexofenadine (ALLEGRA) 180 MG tablet Take 180 mg by  mouth.    [provider]  omeprazole (PRILOSEC) 20 MG capsule  08/26/17   [provider]  ondansetron (ZOFRAN ODT) 4 MG disintegrating tablet Take 1 tablet (4 mg total) by mouth every 8 (eight) hours as needed for nausea or vomiting. Patient not taking: Reported on 10/07/2017 09/16/17   Minna AntisPaduchowski, Kevin, MD    Allergies as of 10/07/2017  . (No Known Allergies)    History reviewed. No pertinent family history.  Social History   Socioeconomic History  . Marital status: Married    Spouse name: Not on file  . Number of children: Not on file  . Years of education: Not on file  . Highest education level: Not on file  Social Needs  . Financial resource strain: Not on file  . Food insecurity - worry: Not on file  . Food insecurity - inability: Not on file  . Transportation needs - medical: Not on file  . Transportation needs - non-medical: Not on file  Occupational History  . Not on file  Tobacco Use  . Smoking status: Never Smoker  . Smokeless tobacco: Never Used  Substance and Sexual Activity  . Alcohol use: No  . Drug use: No  . Sexual activity: Not on file  Other Topics Concern  . Not on file  Social History Narrative  . Not on file    Review of Systems: See HPI, otherwise negative ROS  Physical Exam: BP 131/80   Pulse 79   Temp (!) 97.4 F (36.3 C) (Tympanic)   Resp  18   Ht 5\' 2"  (1.575 m)   Wt 134 lb (60.8 kg)   LMP 10/06/2017 (Approximate)   SpO2 100%   BMI 24.51 kg/m  General:   Alert,  pleasant and cooperative in NAD Head:  Normocephalic and atraumatic. Neck:  Supple; no masses or thyromegaly. Lungs:  Clear throughout to auscultation.    Heart:  Regular rate and rhythm. Abdomen:  Soft, nontender and nondistended. Normal bowel sounds, without guarding, and without rebound.   Neurologic:  Alert and  oriented x4;  grossly normal neurologically.  Impression/Plan: Donna Villarreal is here for an endoscopy to be performed for  dyspepsia  Risks, benefits, limitations, and alternatives regarding  endoscopy have been reviewed with the patient.  Questions have been answered.  All parties agreeable.   Donna Minium, MD  10/14/2017, 11:17 AM

## 2017-10-14 NOTE — Anesthesia Post-op Follow-up Note (Signed)
Anesthesia QCDR form completed.        

## 2017-10-14 NOTE — Progress Notes (Signed)
Pregnancy test given results negative

## 2017-10-14 NOTE — Anesthesia Postprocedure Evaluation (Signed)
Anesthesia Post Note  Patient: Donna NakayamaMaria C Villarreal  Procedure(s) Performed: ESOPHAGOGASTRODUODENOSCOPY (EGD) WITH PROPOFOL (N/A )  Patient location during evaluation: PACU Anesthesia Type: General Level of consciousness: awake and alert and oriented Pain management: pain level controlled Vital Signs Assessment: post-procedure vital signs reviewed and stable Respiratory status: spontaneous breathing Cardiovascular status: blood pressure returned to baseline Anesthetic complications: no     Last Vitals:  Vitals:   10/14/17 1200 10/14/17 1201  BP:    Pulse: 75 81  Resp: 16 19  Temp:    SpO2: 100% 100%    Last Pain:  Vitals:   10/14/17 1150  TempSrc:   PainSc: 3                  Junah Yam

## 2017-10-14 NOTE — Op Note (Signed)
[facility] Gastroenterology Patient Name: Donna Villarreal Procedure Date: 10/14/2017 10:53 AM MRN: [id number] Account #: [id number] Date of Birth: 14-Nov-1970 Admit Type: Outpatient Age: 47 Room: [facility] ENDO ROOM 4 Gender: Female Note Status: Finalized Procedure:            Upper GI endoscopy Indications:          Dyspepsia Providers:            Midge Minium MD, MD Referring MD:         Kerman Passey (Referring MD) Medicines:            Propofol per Anesthesia Complications:        No immediate complications. Procedure:            Pre-Anesthesia Assessment:                       - Prior to the procedure, a History and Physical was                        performed, and patient medications and allergies were                        reviewed. The patient's tolerance of previous                        anesthesia was also reviewed. The risks and benefits of                        the procedure and the sedation options and risks were                        discussed with the patient. All questions were                        answered, and informed consent was obtained. Prior                        Anticoagulants: The patient has taken no previous                        anticoagulant or antiplatelet agents. ASA Grade                        Assessment: II - A patient with mild systemic disease.                        After reviewing the risks and benefits, the patient was                        deemed in satisfactory condition to undergo the                        procedure.                       After obtaining informed consent, the endoscope was                        passed under direct vision. Throughout the procedure,  the patient's blood pressure, pulse, and oxygen                        saturations were monitored continuously. The Endoscope                        was introduced through the mouth, and advanced to the   second part of duodenum. The upper GI endoscopy was                        accomplished without difficulty. The patient tolerated                        the procedure well. Findings:      The Z-line was irregular and was found at the gastroesophageal junction.      Localized minimal inflammation characterized by erythema was found in       the gastric antrum. Biopsies were taken with a cold forceps for       histology.      The examined duodenum was normal. Impression:           - Z-line irregular, at the gastroesophageal junction.                       - Gastritis. Biopsied.                       - Normal examined duodenum. Recommendation:       - Discharge patient to home.                       - Resume previous diet.                       - Continue present medications.                       - Await pathology results. Procedure Code(s):    --- Professional ---                       [id number], Esophagogastroduodenoscopy, flexible, transoral;                        with biopsy, single or multiple Diagnosis Code(s):    --- Professional ---                       R10.13, Epigastric pain                       K29.70, Gastritis, unspecified, without bleeding CPT copyright 2016 American Medical Association. All rights reserved. The codes documented in this report are preliminary and upon coder review may  be revised to meet current compliance requirements. Midge Miniumarren Litha Lamartina MD, MD 10/14/2017 11:36:39 AM This report has been signed electronically. Number of Addenda: 0 Note Initiated On: 10/14/2017 10:53 AM      [facility]

## 2017-10-14 NOTE — Transfer of Care (Signed)
Immediate Anesthesia Transfer of Care Note  Patient: Donna NakayamaMaria C Villarreal  Procedure(s) Performed: ESOPHAGOGASTRODUODENOSCOPY (EGD) WITH PROPOFOL (N/A )  Patient Location: PACU and Endoscopy Unit  Anesthesia Type:General  Level of Consciousness: awake  Airway & Oxygen Therapy: Patient Spontanous Breathing  Post-op Assessment: Report given to RN  Post vital signs: stable  Last Vitals:  Vitals:   10/14/17 1006 10/14/17 1140  BP: 131/80 (!) (P) 108/49  Pulse: 79   Resp: 18 (P) 20  Temp: (!) 36.3 C (!) (P) 36.1 C  SpO2: 100%     Last Pain:  Vitals:   10/14/17 1006  TempSrc: Tympanic      Patients Stated Pain Goal: 6 (10/14/17 1006)  Complications: No apparent anesthesia complications

## 2017-10-14 NOTE — Anesthesia Preprocedure Evaluation (Addendum)
Anesthesia Evaluation  Patient identified by MRN, date of birth, ID band Patient awake    Reviewed: Allergy & Precautions, NPO status , Patient's Chart, lab work & pertinent test results  Airway Mallampati: III  TM Distance: >3 FB     Dental  (+) Partial Upper, Partial Lower   Pulmonary  Reactive airway Hx   Pulmonary exam normal        Cardiovascular negative cardio ROS Normal cardiovascular exam     Neuro/Psych negative neurological ROS  negative psych ROS   GI/Hepatic negative GI ROS, Neg liver ROS,   Endo/Other  negative endocrine ROS  Renal/GU stones  negative genitourinary   Musculoskeletal negative musculoskeletal ROS (+)   Abdominal Normal abdominal exam  (+)   Peds negative pediatric ROS (+)  Hematology negative hematology ROS (+)   Anesthesia Other Findings History reviewed. No pertinent past medical history.  Reproductive/Obstetrics                            Anesthesia Physical Anesthesia Plan  ASA: II  Anesthesia Plan: General   Post-op Pain Management:    Induction: Intravenous  PONV Risk Score and Plan:   Airway Management Planned: Nasal Cannula  Additional Equipment:   Intra-op Plan:   Post-operative Plan:   Informed Consent: I have reviewed the patients History and Physical, chart, labs and discussed the procedure including the risks, benefits and alternatives for the proposed anesthesia with the patient or authorized representative who has indicated his/her understanding and acceptance.   Dental advisory given  Plan Discussed with: CRNA and Surgeon  Anesthesia Plan Comments:         Anesthesia Quick Evaluation

## 2017-10-15 ENCOUNTER — Encounter: Payer: Self-pay | Admitting: Gastroenterology

## 2017-10-15 LAB — SURGICAL PATHOLOGY

## 2017-10-16 ENCOUNTER — Encounter: Payer: Self-pay | Admitting: Gastroenterology

## 2018-12-28 ENCOUNTER — Encounter: Payer: Self-pay | Admitting: Internal Medicine

## 2018-12-28 NOTE — Telephone Encounter (Signed)
This encounter was created in error - please disregard.

## 2019-02-19 IMAGING — CT CT RENAL STONE PROTOCOL
2 of 4 series · 16 of 46 positions shown, 18 images · non-contrast
Comparison: 04/21/2012

CLINICAL DATA: LLQ pain running across abdomen, x2mos, nausea and
constipation, pt states she has chills but denies fever, hx
c-section, denies chance of pregnancy, LMP now.

EXAM:
CT ABDOMEN AND PELVIS WITHOUT CONTRAST
TECHNIQUE: Multidetector CT imaging of the abdomen and pelvis was performed
following the standard protocol without IV contrast.

[Series 2: renal stone 5.00 br36 s3 · axial · 0.63mm/px · z∈[-1639,-1229]mm · 13 of 90 slices shown, 15 images]
[im 4/90  soft-tissue]
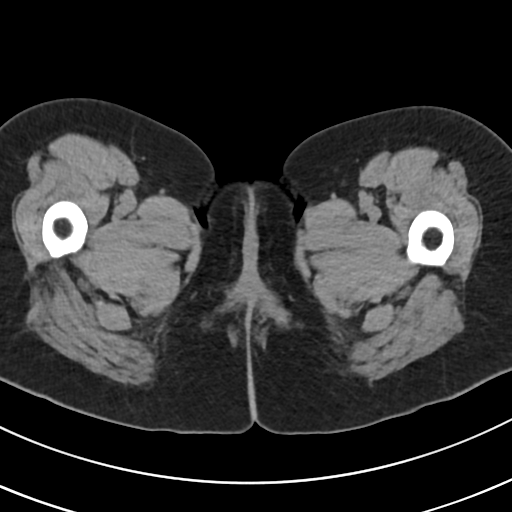
[im 4/90  bone]
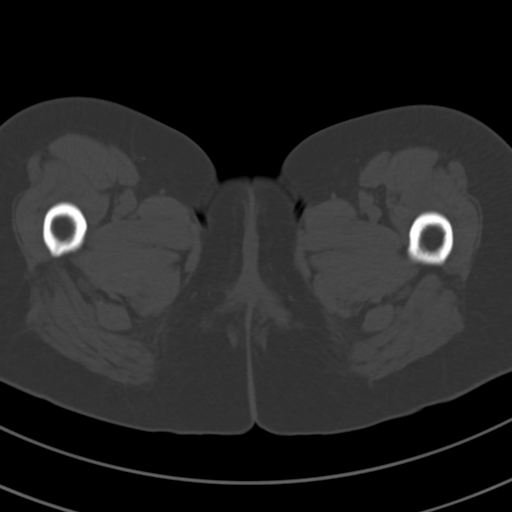
[im 11/90  soft-tissue]
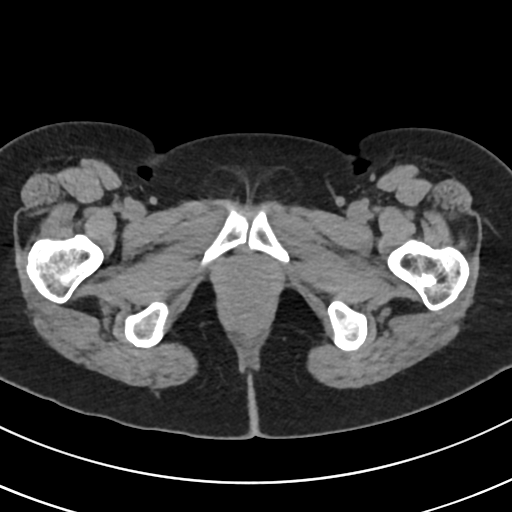
[im 18/90  soft-tissue]
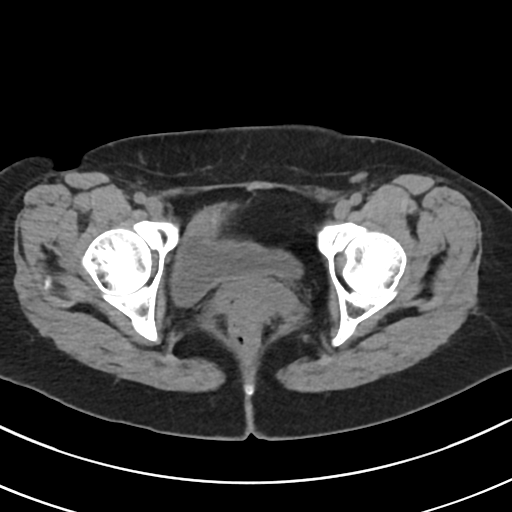
[im 25/90  soft-tissue]
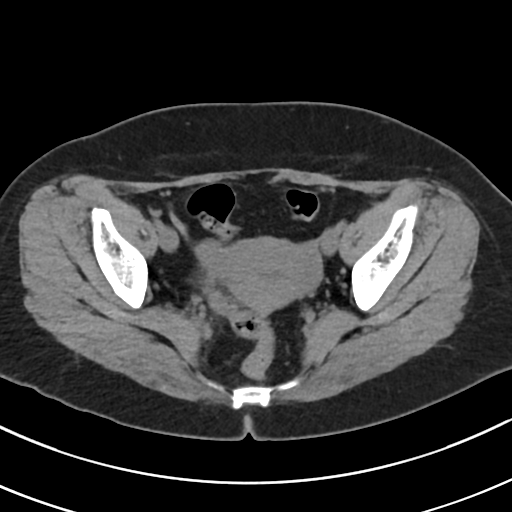
[im 33/90  soft-tissue]
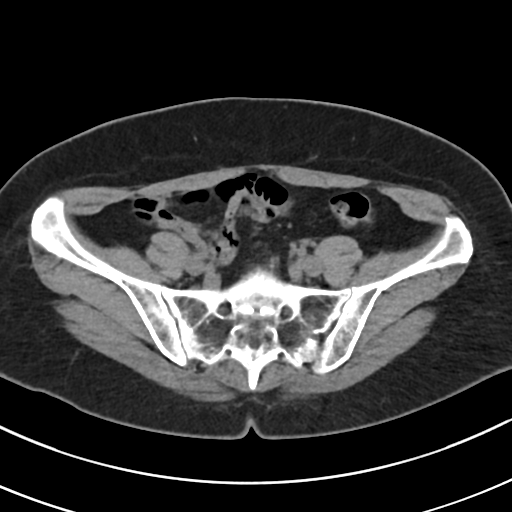
[im 40/90  soft-tissue]
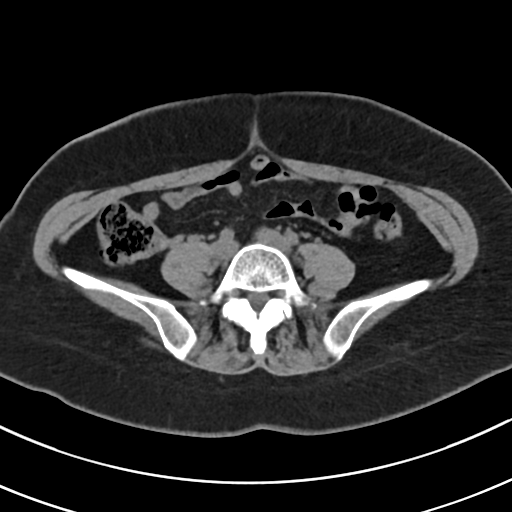
[im 47/90  soft-tissue]
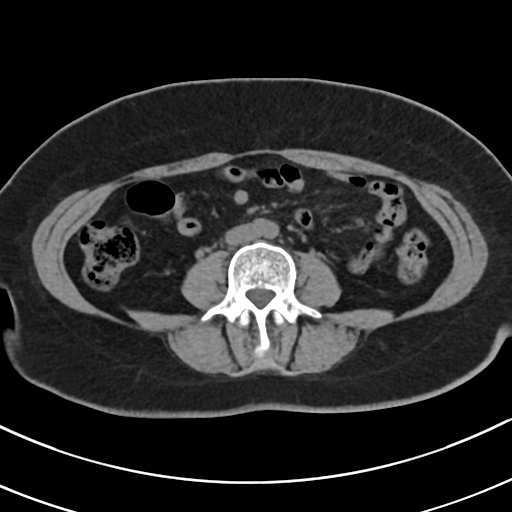
[im 50/90  soft-tissue]
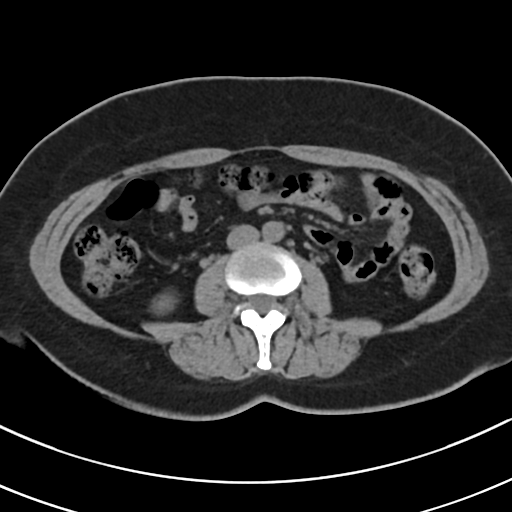
[im 57/90  soft-tissue]
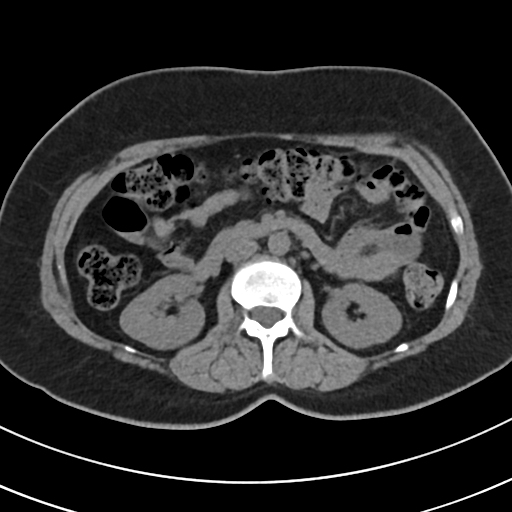
[im 57/90  bone]
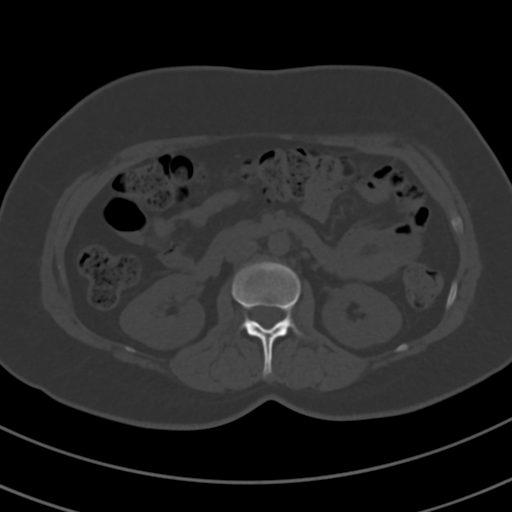
[im 65/90  soft-tissue]
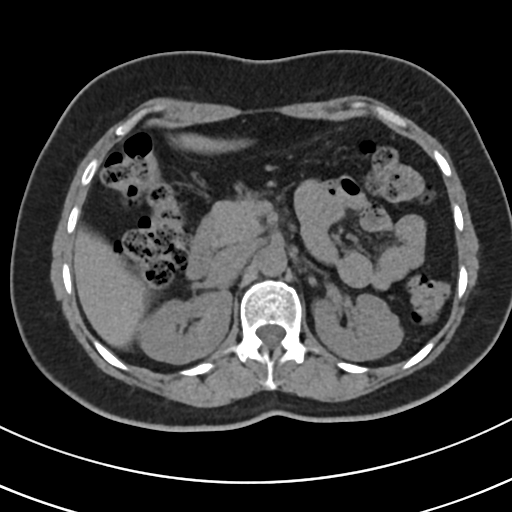
[im 72/90  soft-tissue]
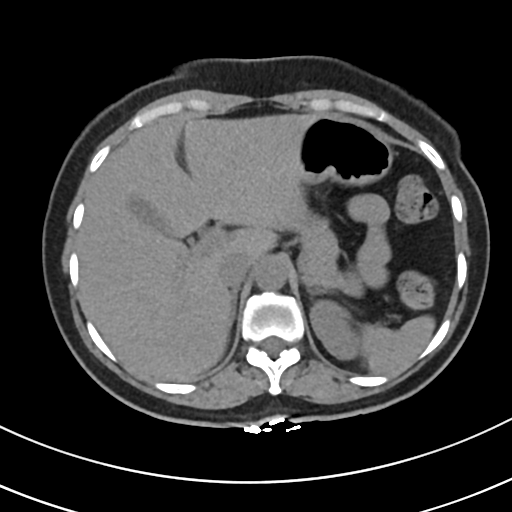
[im 79/90  soft-tissue]
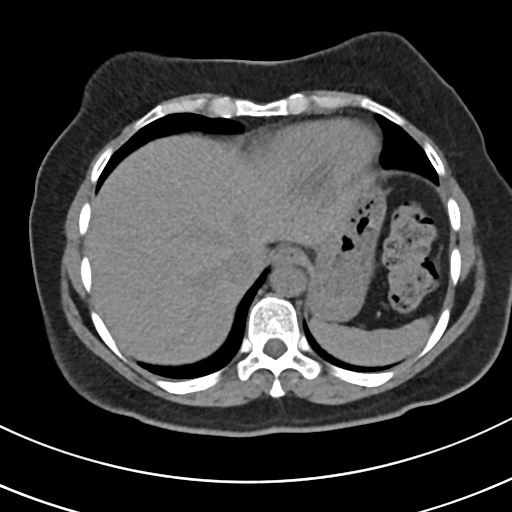
[im 86/90  soft-tissue]
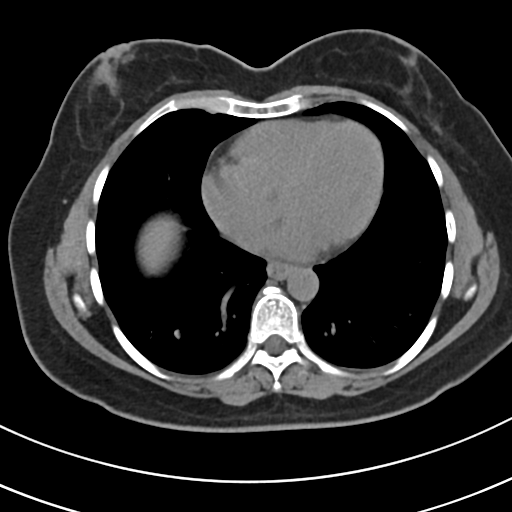

[Series 4: renal stone 2.00 br36 s3 cor · coronal · 0.65mm/px · 3 of 124 slices shown]
[im 42/124  soft-tissue]
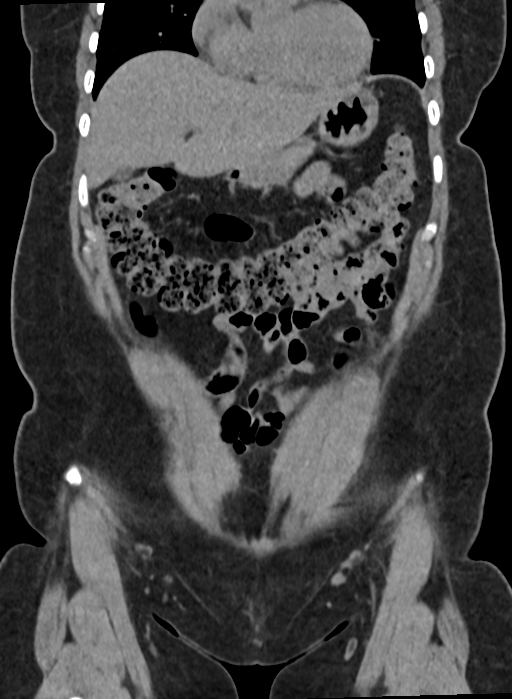
[im 55/124  soft-tissue]
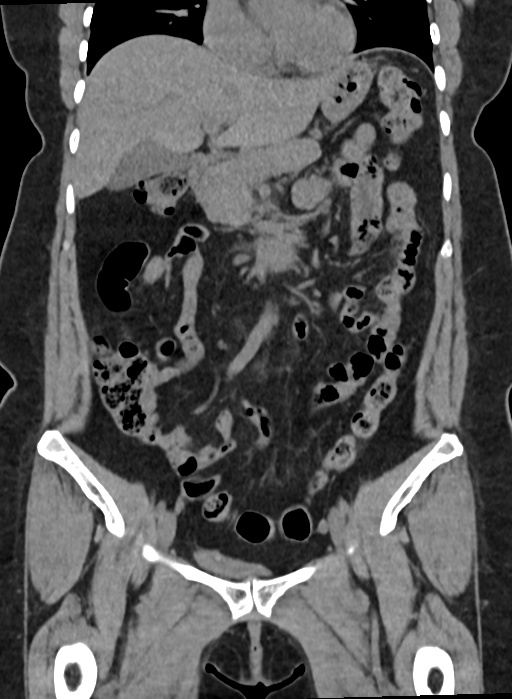
[im 69/124  soft-tissue]
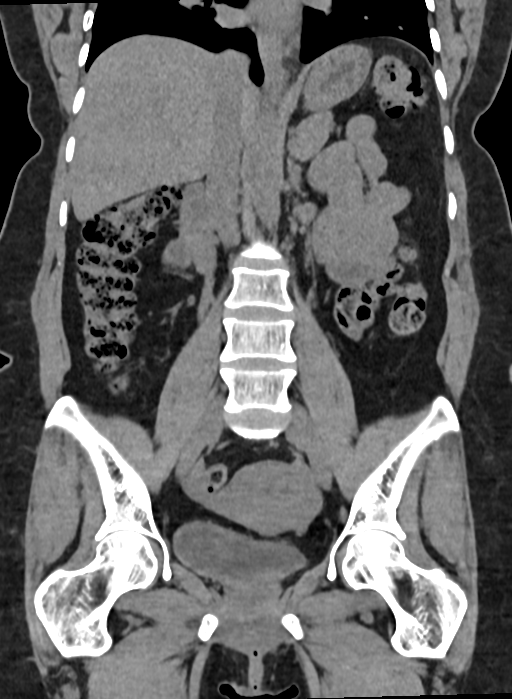

[16 of 46 positions shown; findings below may reference images not displayed]

FINDINGS: Lower chest: No acute abnormality.

Hepatobiliary: No focal liver abnormality is seen. No gallstones,
gallbladder wall thickening, or biliary dilatation.

Pancreas: Unremarkable. No pancreatic ductal dilatation or
surrounding inflammatory changes.

Spleen: Normal in size without focal abnormality.

Adrenals/Urinary Tract: Normal adrenal glands. No hydronephrosis or
ureterectasis. 0.1 cm mid right renal collecting system calculus.
0.2 cm calcification in the lower pole left kidney as before.
Urinary bladder incompletely distended.

Stomach/Bowel: Stomach and small bowel are decompressed. Normal
appendix. Colon is nondilated, unremarkable.

Vascular/Lymphatic: No significant vascular findings are present. No
enlarged abdominal or pelvic lymph nodes.

Reproductive: Uterus and bilateral adnexa are unremarkable.

Other: No ascites.  No free air.

Musculoskeletal: Anterior vertebral endplate spurring at multiple
levels in the lower thoracic spine. No fracture or worrisome bone
lesion.
IMPRESSION: 1. No acute findings.
2. Stable bilateral nephrolithiasis without hydronephrosis.

## 2019-03-15 ENCOUNTER — Emergency Department: Payer: 59

## 2019-03-15 ENCOUNTER — Other Ambulatory Visit: Payer: Self-pay

## 2019-03-15 ENCOUNTER — Emergency Department
Admission: EM | Admit: 2019-03-15 | Discharge: 2019-03-15 | Disposition: A | Discharge status: Home / Self Care | Payer: 59 | Attending: Emergency Medicine | Admitting: Emergency Medicine

## 2019-03-15 ENCOUNTER — Encounter: Payer: Self-pay | Admitting: Emergency Medicine

## 2019-03-15 DIAGNOSIS — I10 Essential (primary) hypertension: Secondary | ICD-10-CM | POA: Diagnosis not present

## 2019-03-15 DIAGNOSIS — G8929 Other chronic pain: Secondary | ICD-10-CM | POA: Diagnosis not present

## 2019-03-15 DIAGNOSIS — Z79899 Other long term (current) drug therapy: Secondary | ICD-10-CM | POA: Insufficient documentation

## 2019-03-15 DIAGNOSIS — M25512 Pain in left shoulder: Secondary | ICD-10-CM | POA: Insufficient documentation

## 2019-03-15 HISTORY — DX: Unspecified osteoarthritis, unspecified site: M19.90

## 2019-03-15 HISTORY — DX: Essential (primary) hypertension: I10

## 2019-03-15 LAB — CBC
HCT: 35.4 % — ABNORMAL LOW (ref 36.0–46.0)
Hemoglobin: 12.1 g/dL (ref 12.0–15.0)
MCH: 31.3 pg (ref 26.0–34.0)
MCHC: 34.2 g/dL (ref 30.0–36.0)
MCV: 91.5 fL (ref 80.0–100.0)
Platelets: 240 10*3/uL (ref 150–400)
RBC: 3.87 MIL/uL (ref 3.87–5.11)
RDW: 12.6 % (ref 11.5–15.5)
WBC: 12.1 10*3/uL — ABNORMAL HIGH (ref 4.0–10.5)
nRBC: 0 % (ref 0.0–0.2)

## 2019-03-15 LAB — BASIC METABOLIC PANEL
Anion gap: 9 (ref 5–15)
BUN: 17 mg/dL (ref 6–20)
CO2: 23 mmol/L (ref 22–32)
Calcium: 9.6 mg/dL (ref 8.9–10.3)
Chloride: 104 mmol/L (ref 98–111)
Creatinine, Ser: 0.52 mg/dL (ref 0.44–1.00)
GFR calc Af Amer: >60 mL/min (ref >60)
GFR calc non Af Amer: >60 mL/min (ref >60)
Glucose, Bld: 127 mg/dL — ABNORMAL HIGH (ref 70–99)
Potassium: 3.6 mmol/L (ref 3.5–5.1)
Sodium: 136 mmol/L (ref 135–145)

## 2019-03-15 LAB — TROPONIN I (HIGH SENSITIVITY)
Troponin I (High Sensitivity): 4 ng/L (ref <18)
Troponin I (High Sensitivity): 3 ng/L (ref <18)

## 2019-03-15 LAB — POCT PREGNANCY, URINE: Preg Test, Ur: NEGATIVE

## 2019-03-15 MED ORDER — ACETAMINOPHEN 325 MG PO TABS
650.0000 mg | ORAL_TABLET | Freq: Once | ORAL | Status: AC
  Administered 2019-03-15: 650 mg via ORAL
  Filled 2019-03-15: qty 2

## 2019-03-15 MED ORDER — KETOROLAC TROMETHAMINE 30 MG/ML IJ SOLN
30.0000 mg | Freq: Once | INTRAMUSCULAR | Status: DC
  Filled 2019-03-15: qty 1

## 2019-03-15 MED ORDER — KETOROLAC TROMETHAMINE 30 MG/ML IJ SOLN
30.0000 mg | Freq: Once | INTRAMUSCULAR | Status: AC
  Administered 2019-03-15: 30 mg via INTRAMUSCULAR

## 2019-03-15 MED ORDER — NAPROXEN 500 MG PO TABS: 500.0000 mg | ORAL_TABLET | Freq: Two times a day (BID) | ORAL | 0 refills | Status: AC

## 2019-03-15 NOTE — ED Provider Notes (Addendum)
[facility] Emergency Department Provider Note  ____________________________________________   First MD Initiated Contact with Patient 03/15/19 1829     (approximate)  I have reviewed the triage vital signs and the nursing notes.   HISTORY  Chief Complaint Back Pain, Chest Pain, Shortness of Breath, and Arm Pain    HPI Donna Villarreal is a 48 y.o. female with HTN, arthritis, who presents with pain.   Back pain has been going on for 1 year and shoulder pain has been going on for 9 months. Pt presented today she had to return to work and her pain was too strong.   Today was first day back to work after taking off for her shoulder pain.  She has been out since April 6th.  Pt said she wasn't ready to go back to work.  The doctor however said she had no indication to not go back to work.  She says she is here today because she does not want to go back to work. At home she is taking ibuprofen and gel for her pain.  She has been seen at urgent care orthopedics.  She does endorse some shortness of breath that is been going on for 2 weeks, intermittent, nothing makes it better or worse. Denies any history of blood clots, leg swelling, travel, surgery   Spanish interpretor was used for history and physical exam.      Past Medical History:  Diagnosis Date  . Arthritis   . Hypertension     Patient Active Problem List   Diagnosis Date Noted  . Dyspepsia   . Urinary urgency 12/10/2012  . History of urinary stone 11/26/2012  . Incontinence of urine 11/26/2012  . Renal stone 11/26/2012    Past Surgical History:  Procedure Laterality Date  . CESAREAN SECTION    . ESOPHAGOGASTRODUODENOSCOPY (EGD) WITH PROPOFOL N/A 10/14/2017   Procedure: ESOPHAGOGASTRODUODENOSCOPY (EGD) WITH PROPOFOL;  Surgeon: Lucilla Lame, MD;  Location: [facility] ENDOSCOPY;  Service: Endoscopy;  Laterality: N/A;    Prior to Admission medications   Medication Sig Start Date End Date  Taking? Authorizing Provider  acetaminophen (TYLENOL) 500 MG tablet Take 1,000 mg by mouth. 11/19/13   [provider]  albuterol (PROVENTIL HFA) 108 (90 Base) MCG/ACT inhaler 2 puffs q.i.d. p.r.n. short of breath, wheezing, or cough 06/20/17   [provider]  azelastine (OPTIVAR) 0.05 % ophthalmic solution  04/18/17   [provider]  cetirizine (ZYRTEC) 10 MG tablet Take 10 mg by mouth daily.    [provider]  cyclobenzaprine (FLEXERIL) 5 MG tablet Take 5 mg by mouth. 11/19/13   [provider]  dicyclomine (BENTYL) 20 MG tablet Take 1 tablet (20 mg total) by mouth 3 (three) times daily as needed for spasms. Patient not taking: Reported on 10/14/2017 09/16/17 09/16/18  Harvest Dark, MD  DULoxetine (CYMBALTA) 30 MG capsule  08/26/17   [provider]  fexofenadine (ALLEGRA) 180 MG tablet Take 180 mg by mouth.    [provider]  fluticasone (FLONASE) 50 MCG/ACT nasal spray Place into the nose. 07/29/17   [provider]  Olopatadine HCl 0.2 % SOLN  09/23/17   [provider]  omeprazole (PRILOSEC) 20 MG capsule  08/26/17   [provider]  ondansetron (ZOFRAN ODT) 4 MG disintegrating tablet Take 1 tablet (4 mg total) by mouth every 8 (eight) hours as needed for nausea or vomiting. Patient not taking: Reported on 10/07/2017 09/16/17   Harvest Dark, MD  oxybutynin (DITROPAN-XL) 10 MG 24 hr tablet Take 10 mg by mouth. 12/10/12   [provider]  promethazine (PHENERGAN) 12.5 MG tablet Take 1 tablet (12.5 mg total) by mouth every 6 (six) hours as needed for nausea or vomiting. 10/29/15   Jeanmarie PlantMcShane, James A, MD  ROBAFEN AC 100-10 MG/5ML syrup  07/29/17   [provider]    Allergies Patient has no known allergies.  No family history on file.  Social History Social History   Tobacco Use  . Smoking status: Never Smoker  . Smokeless tobacco: Never Used  Substance Use Topics  . Alcohol use: No   . Drug use: No      Review of Systems Constitutional: No fever/chills Eyes: No visual changes. ENT: No sore throat. Cardiovascular: Negative for chest pain Respiratory: Positive for shortness of breath Gastrointestinal: No abdominal pain.  No nausea, no vomiting.  No diarrhea.  No constipation. Genitourinary: Negative for dysuria. Musculoskeletal: Positive for back pain and left shoulder pain Skin: Negative for rash. Neurological: Negative for headaches, focal weakness or numbness. All other ROS negative ____________________________________________   PHYSICAL EXAM:  VITAL SIGNS: ED Triage Vitals  Enc Vitals Group     BP 03/15/19 1519 121/71     Pulse Rate 03/15/19 1519 91     Resp 03/15/19 1519 16     Temp 03/15/19 1519 98.1 F (36.7 C)     Temp Source 03/15/19 1519 Oral     SpO2 03/15/19 1519 100 %     Weight --      Height --      Head Circumference --      Peak Flow --      Pain Score 03/15/19 1526 9     Pain Loc --      Pain Edu? --      Excl. in [place]? --     Constitutional: Alert and oriented. Well appearing and in no acute distress. Eyes: Conjunctivae are normal. EOMI. Head: Atraumatic. Nose: No congestion/rhinnorhea. Mouth/Throat: Mucous membranes are moist.   Neck: No stridor. Trachea Midline. FROM Cardiovascular: Normal rate, regular rhythm. Grossly normal heart sounds.  Good peripheral circulation. Respiratory: Normal respiratory effort.  No retractions. Lungs CTAB. Gastrointestinal: Soft and nontender. No distention. No abdominal bruits.  Musculoskeletal: No lower extremity tenderness nor edema.  No joint effusions.  Pain with palpation on the left shoulder with full range of motion and good distal pulse no warmth or erythema Neurologic:  Normal speech and language. No gross focal neurologic deficits are appreciated.  Skin:  Skin is warm, dry and intact. No rash noted. Psychiatric: Mood and affect are normal. Speech and behavior are normal. GU:  Deferred   ____________________________________________   LABS (all labs ordered are listed, but only abnormal results are displayed)  Labs Reviewed  BASIC METABOLIC PANEL - Abnormal; Notable for the following components:      Result Value   Glucose, Bld 127 (*)    All other components within normal limits  CBC - Abnormal; Notable for the following components:   WBC 12.1 (*)    HCT 35.4 (*)    All other components within normal limits  POCT PREGNANCY, URINE  POC URINE PREG, ED  TROPONIN I (HIGH SENSITIVITY)  TROPONIN I (HIGH SENSITIVITY)   ____________________________________________   ED ECG REPORT I, Concha SeMary E Martell Mcfadyen, the attending physician, personally viewed and interpreted this ECG.  EKG shows sinus rate of 87, no ST elevation, no T wave inversion, normal intervals ____________________________________________  RADIOLOGY I, Concha SeMary E Hayward Rylander, personally viewed and evaluated these images (plain radiographs) as part of my medical decision making, as well as reviewing the written report by the radiologist.  ED MD interpretation:  No PNA   Official radiology report(s): Dg Chest 2 View  Result Date: 03/15/2019 CLINICAL DATA:  48 year old female with left side chest pain radiating to the upper extremity and back. EXAM: CHEST - 2 VIEW COMPARISON:  CT Chest, Abdomen, and Pelvis 08/30/2011. CT Abdomen and Pelvis 09/11/2017. FINDINGS: Mildly lower lung volumes. Mediastinal contours remain normal. Visualized tracheal air column is within normal limits. Both lungs appear clear. No pneumothorax or pleural effusion. Negative visible bowel gas pattern. No acute osseous abnormality identified. Thoracic disc space loss and endplate spurring. IMPRESSION: 1.  No cardiopulmonary abnormality. 2. Thoracic disc and endplate degeneration. Electronically Signed   By: Odessa FlemingH  Hall M.D.   On: 03/15/2019 16:28    ____________________________________________   PROCEDURES  Procedure(s) performed (including  Critical Care):  Procedures   ____________________________________________   INITIAL IMPRESSION / ASSESSMENT AND PLAN / ED COURSE   Donna Villarreal was evaluated in Emergency Department on 03/15/2019 for the symptoms described in the history of present illness. She was evaluated in the context of the global COVID-19 pandemic, which necessitated consideration that the patient might be at risk for infection with the SARS-CoV-2 virus that causes COVID-19. Institutional protocols and algorithms that pertain to the evaluation of patients at risk for COVID-19 are in a state of rapid change based on information released by regulatory bodies including the CDC and federal and state organizations. These policies and algorithms were followed during the patient's care in the ED.     Patient is a 48 year old who is been having chronic pain for the past year who presented today after having to return back to work and feeling like she could not do it due to her pain.  Patient is already followed by Ortho clinic.  Patient has full range of motion of her shoulder although she is tender on palpation.  No evidence of septic joint.  Will do labs to evaluate for ACS.  Low suspicion.  Patient is PERC negative therefore low suspicion for PE.   DDx that was also considered d/t potential to cause harm, but was found less likely based on history and physical (as detailed above): -PNA (no fevers, cough but CXR to evaluate) -PNX (reassured with equal b/l breath sounds, CXR to evaluate) -Symptomatic anemia (will get H&H) -Pulmonary embolism as no sob at rest, not pleuritic in nature, no hypoxia -Aortic Dissection as no tearing pain and no radiation to the mid back, pulses equal -Pericarditis no rub on exam, EKG changes or hx to suggest dx -Tamponade (no notable SOB, tachycardic, hypotensive) -Esophageal rupture (no h/o diffuse vomitting/no crepitus)   Clinical Course as of Mar 16 1111  Mon Mar 15, 2019  1945  Troponin I (High Sensitivity) [MF]    Clinical Course User Index [MF] Concha SeFunke, Yasemin Rabon E, MD   Labs are reassuring.  Patient has a negative pregnancy test.  Cardiac marker is 4.  Labs show no evidence of electrolyte abnormalities or AKI.  Chest x-ray was negative for pneumonia or pneumothorax.  We will give patient a dose of Toradol and Tylenol.  Recommend patient take naproxen in combination with Tylenol and have outpatient follow-up with her primary care doctor    ____________________________________________   FINAL CLINICAL IMPRESSION(S) / ED DIAGNOSES   Final diagnoses:  Chronic left shoulder pain  MEDICATIONS GIVEN DURING THIS VISIT:  Medications  acetaminophen (TYLENOL) tablet 650 mg (650 mg Oral Given 03/15/19 1854)  ketorolac (TORADOL) 30 MG/ML injection 30 mg (30 mg Intramuscular Given 03/15/19 1855)     ED Discharge Orders         Ordered    naproxen (NAPROSYN) 500 MG tablet  2 times daily with meals     03/15/19 1925           Note:  This document was prepared using Dragon voice recognition software and may include unintentional dictation errors.   Concha SeFunke, Waver Dibiasio E, MD 03/15/19 1925    Concha SeFunke, Amina Menchaca E, MD 03/16/19 [phone]

## 2019-03-15 NOTE — ED Notes (Signed)
INTERPRETER REQUESTED.

## 2019-03-15 NOTE — Discharge Instructions (Signed)
Your work-up was reassuring.  You should take the naproxen in combination with Tylenol to help with your pain.  You should follow-up with the orthopedic doctors or your primary care doctor.

## 2019-03-15 NOTE — ED Triage Notes (Addendum)
For months back neck and arm pain.  Mri months ago.  Says the pain is unbearable now.  She also has chest pain and short of breath Says was givne injection for arm pain 22 days ago and felt short of breath and chest p ain and numbness in hands since then. Then had another mri on Monday.  Says she was told then that she could go back to work, but she tried to go to work.  Says she gets tired and the pain was bad.

## 2020-02-02 ENCOUNTER — Emergency Department
Admission: EM | Admit: 2020-02-02 | Discharge: 2020-02-02 | Disposition: A | Discharge status: Home / Self Care | Payer: 59 | Attending: Emergency Medicine | Admitting: Emergency Medicine

## 2020-02-02 ENCOUNTER — Other Ambulatory Visit: Payer: Self-pay

## 2020-02-02 DIAGNOSIS — Z9889 Other specified postprocedural states: Secondary | ICD-10-CM | POA: Insufficient documentation

## 2020-02-02 DIAGNOSIS — I10 Essential (primary) hypertension: Secondary | ICD-10-CM | POA: Insufficient documentation

## 2020-02-02 DIAGNOSIS — R55 Syncope and collapse: Secondary | ICD-10-CM | POA: Insufficient documentation

## 2020-02-02 DIAGNOSIS — M25512 Pain in left shoulder: Secondary | ICD-10-CM | POA: Insufficient documentation

## 2020-02-02 LAB — BASIC METABOLIC PANEL
Anion gap: 6 (ref 5–15)
BUN: 13 mg/dL (ref 6–20)
CO2: 26 mmol/L (ref 22–32)
Calcium: 8.7 mg/dL — ABNORMAL LOW (ref 8.9–10.3)
Chloride: 106 mmol/L (ref 98–111)
Creatinine, Ser: 0.52 mg/dL (ref 0.44–1.00)
GFR calc Af Amer: >60 mL/min (ref >60)
GFR calc non Af Amer: >60 mL/min (ref >60)
Glucose, Bld: 102 mg/dL — ABNORMAL HIGH (ref 70–99)
Potassium: 4 mmol/L (ref 3.5–5.1)
Sodium: 138 mmol/L (ref 135–145)

## 2020-02-02 LAB — CBC WITH DIFFERENTIAL/PLATELET
Abs Immature Granulocytes: 0.04 10*3/uL (ref 0.00–0.07)
Basophils Absolute: 0.1 10*3/uL (ref 0.0–0.1)
Basophils Relative: 1 %
Eosinophils Absolute: 0.1 10*3/uL (ref 0.0–0.5)
Eosinophils Relative: 1 %
HCT: 32.7 % — ABNORMAL LOW (ref 36.0–46.0)
Hemoglobin: 11 g/dL — ABNORMAL LOW (ref 12.0–15.0)
Immature Granulocytes: 0 %
Lymphocytes Relative: 22 %
Lymphs Abs: 2.6 10*3/uL (ref 0.7–4.0)
MCH: 30.7 pg (ref 26.0–34.0)
MCHC: 33.6 g/dL (ref 30.0–36.0)
MCV: 91.3 fL (ref 80.0–100.0)
Monocytes Absolute: 0.8 10*3/uL (ref 0.1–1.0)
Monocytes Relative: 6 %
Neutro Abs: 8.5 10*3/uL — ABNORMAL HIGH (ref 1.7–7.7)
Neutrophils Relative %: 70 %
Platelets: 218 10*3/uL (ref 150–400)
RBC: 3.58 MIL/uL — ABNORMAL LOW (ref 3.87–5.11)
RDW: 13.2 % (ref 11.5–15.5)
WBC: 12.1 10*3/uL — ABNORMAL HIGH (ref 4.0–10.5)
nRBC: 0 % (ref 0.0–0.2)

## 2020-02-02 MED ORDER — SODIUM CHLORIDE 0.9 % IV BOLUS
500.0000 mL | Freq: Once | INTRAVENOUS | Status: AC
  Administered 2020-02-02: 500 mL via INTRAVENOUS

## 2020-02-02 MED ORDER — HYDROCODONE-ACETAMINOPHEN 5-325 MG PO TABS: 1.0000 | ORAL_TABLET | Freq: Three times a day (TID) | ORAL | 0 refills | Status: AC | PRN

## 2020-02-02 MED ORDER — HYDROCODONE-ACETAMINOPHEN 5-325 MG PO TABS
1.0000 | ORAL_TABLET | Freq: Once | ORAL | Status: AC
  Administered 2020-02-02: 1 via ORAL
  Filled 2020-02-02: qty 1

## 2020-02-02 MED ORDER — ONDANSETRON 4 MG PO TBDP
4.0000 mg | ORAL_TABLET | Freq: Once | ORAL | Status: AC
  Administered 2020-02-02: 4 mg via ORAL
  Filled 2020-02-02: qty 1

## 2020-02-02 NOTE — ED Triage Notes (Signed)
Pt had shoulder surgery on Friday and reports a strong pain today and passed out and was brought here. Pain and numbness in her left hand as well.

## 2020-02-02 NOTE — ED Notes (Signed)
See triage note  Presents with pain to left shoulder   States had recent surgery    States she did have sharp strong pain in arm

## 2020-02-02 NOTE — ED Triage Notes (Signed)
First nurse note- syncope at home. Daughter told EMS out for 2-3 seconds. CBG 106 with EMS. VSS with EMS. No fall.

## 2020-02-02 NOTE — Discharge Instructions (Addendum)
Your exam is overall benign at this time.  Your labs are reassuring as a show no significant abnormalities.  Some mild anemia is appreciated which can be followed by your primary care provider.  Take the pain medicine with food, as directed and follow-up with your primary provider or orthopedic surgeon as planned.  Return to the ED as needed.

## 2020-02-02 NOTE — ED Provider Notes (Signed)
[facility] Emergency Department Provider Note ____________________________________________  Time seen: 1913  I have reviewed the triage vital signs and the nursing notes.  HISTORY  Chief Complaint  Shoulder Pain  History limited by Spanish language.  Patient's adult daughter is present for interview and exam.  HPI Donna BROMWELL is a 49 y.o. female presents to the ED via EMS from home, after syncopal episode.  Patient apparently is about 5 days status post surgery  for a left rotator cuff tear, at [facility].  She apparently took her first dose of Roxicodone last night and a second dose again today for pain.  Patient apparently went to stand shortly after lunch, and felt dizzy and lightheaded.  Her other adult daughter was at her side, and lowered the patient to a chair.  The reported that the patient passed out for about 2 to 3 seconds in the chair and then vomited shortly after.  Patient did not fall, hit her head, or have incontinence.  EMS was called to evaluate the patient reports a CBG of 106 mg/DL upon their arrival.  Patient reportedly had a second syncopal episode while EMS was there.  She presents now for evaluation of her symptoms reporting no dizziness, chest pain, or shortness of breath.  Patient relates pain to the left shoulder and paresthesias following her dental block.  Patient has never had this pain medicine before, but did have a normal meal prior to the onset of syncope.  Patient otherwise is healthy despite history of arthritis and hypertension.  Past Medical History:  Diagnosis Date  . Arthritis   . Hypertension     Patient Active Problem List   Diagnosis Date Noted  . Dyspepsia   . Urinary urgency 12/10/2012  . History of urinary stone 11/26/2012  . Incontinence of urine 11/26/2012  . Renal stone 11/26/2012    Past Surgical History:  Procedure Laterality Date  . CESAREAN SECTION    . ESOPHAGOGASTRODUODENOSCOPY (EGD) WITH  PROPOFOL N/A 10/14/2017   Procedure: ESOPHAGOGASTRODUODENOSCOPY (EGD) WITH PROPOFOL;  Surgeon: Midge Minium, MD;  Location: [facility] ENDOSCOPY;  Service: Endoscopy;  Laterality: N/A;    Prior to Admission medications   Medication Sig Start Date End Date Taking? Authorizing Provider  acetaminophen (TYLENOL) 500 MG tablet Take 1,000 mg by mouth. 11/19/13   [provider]  albuterol (PROVENTIL HFA) 108 (90 Base) MCG/ACT inhaler 2 puffs q.i.d. p.r.n. short of breath, wheezing, or cough 06/20/17   [provider]  azelastine (OPTIVAR) 0.05 % ophthalmic solution  04/18/17   [provider]  cetirizine (ZYRTEC) 10 MG tablet Take 10 mg by mouth daily.    [provider]  cyclobenzaprine (FLEXERIL) 5 MG tablet Take 5 mg by mouth. 11/19/13   [provider]  DULoxetine (CYMBALTA) 30 MG capsule  08/26/17   [provider]  fexofenadine (ALLEGRA) 180 MG tablet Take 180 mg by mouth.    [provider]  fluticasone (FLONASE) 50 MCG/ACT nasal spray Place into the nose. 07/29/17   [provider]  HYDROcodone-acetaminophen (NORCO) 5-325 MG tablet Take 1 tablet by mouth 3 (three) times daily as needed for up to 3 days. 02/02/20 02/05/20  Loras Grieshop, Charlesetta Ivory, PA-C  Olopatadine HCl 0.2 % SOLN  09/23/17   [provider]  omeprazole (PRILOSEC) 20 MG capsule  08/26/17   [provider]  oxybutynin (DITROPAN-XL) 10 MG 24 hr tablet Take 10 mg by mouth. 12/10/12   [provider]  promethazine (PHENERGAN)  12.5 MG tablet Take 1 tablet (12.5 mg total) by mouth every 6 (six) hours as needed for nausea or vomiting. 10/29/15   Schuyler Amor, MD  ROBAFEN AC 100-10 MG/5ML syrup  07/29/17   [provider]    Allergies Patient has no known allergies.  History reviewed. No pertinent family history.  Social History Social History   Tobacco Use  . Smoking status: Never Smoker  . Smokeless tobacco: Never Used  Vaping Use   . Vaping Use: Never used  Substance Use Topics  . Alcohol use: No  . Drug use: No    Review of Systems  Constitutional: Negative for fever. Eyes: Negative for visual changes. ENT: Negative for sore throat. Cardiovascular: Negative for chest pain.  Syncopal episode is reported. Respiratory: Negative for shortness of breath. Gastrointestinal: Negative for abdominal pain, vomiting and diarrhea. Genitourinary: Negative for dysuria. Musculoskeletal: Negative for back pain.  Left shoulder pain as above. Skin: Negative for rash. Neurological: Negative for headaches, focal weakness or numbness. ____________________________________________  PHYSICAL EXAM:  VITAL SIGNS: ED Triage Vitals  Enc Vitals Group     BP 02/02/20 1718 127/74     Pulse Rate 02/02/20 1718 78     Resp 02/02/20 1718 16     Temp 02/02/20 1718 97.8 F (36.6 C)     Temp Source 02/02/20 1718 Oral     SpO2 02/02/20 1718 99 %     Weight 02/02/20 1721 142 lb (64.4 kg)     Height 02/02/20 1721 5' (1.524 m)     Head Circumference --      Peak Flow --      Pain Score 02/02/20 1720 9     Pain Loc --      Pain Edu? --      Excl. in [place]? --     Constitutional: Alert and oriented. Well appearing and in no distress.  GCS = 15 Head: Normocephalic and atraumatic. Eyes: Conjunctivae are normal. Normal extraocular movements Cardiovascular: Normal rate, regular rhythm. Normal distal pulses. Respiratory: Normal respiratory effort. No wheezes/rales/rhonchi. Musculoskeletal: Nontender with normal range of motion in all extremities.  Neurologic:  Normal gait without ataxia. Normal speech and language. No gross focal neurologic deficits are appreciated. Skin:  Skin is warm, dry and intact. No rash noted. Psychiatric: Mood and affect are normal. Patient exhibits appropriate insight and judgment. ____________________________________________   LABS (pertinent positives/negatives)  Labs Reviewed  CBC WITH DIFFERENTIAL/PLATELET -  Abnormal; Notable for the following components:      Result Value   WBC 12.1 (*)    RBC 3.58 (*)    Hemoglobin 11.0 (*)    HCT 32.7 (*)    Neutro Abs 8.5 (*)    All other components within normal limits  BASIC METABOLIC PANEL - Abnormal; Notable for the following components:   Glucose, Bld 102 (*)    Calcium 8.7 (*)    All other components within normal limits  ____________________________________________  EKG NSR 76 bpm PR Interval 178 ms QRS duration 74 ms No STEMI ______________________________________________  PROCEDURES  Zofran 4 ODT  Norco 5-325 mg PO NS bolus 500 mg IVP  Procedures ____________________________________________  INITIAL IMPRESSION / ASSESSMENT AND PLAN / ED COURSE  Patient presents to the ED for evaluation of syncopal episode prior to arrival.  Patient is about 5 days status post surgery, for a left rotator cuff repair.  Her exam is benign and reassuring at this time.  No signs of any acute arrhythmia, dehydration, or  acute infection.  Patient reports provement of her symptoms after oral medication and IV fluid hydration.  She will discharged with a prescription for hydrocodone to take in place of the Roxicet provided.Marland Kitchen  She will follow-up with her primary provider or orthopedic surgeon for ongoing symptoms peer return precautions have been reviewed.  Donna Villarreal was evaluated in Emergency Department on 02/02/2020 for the symptoms described in the history of present illness. She was evaluated in the context of the global COVID-19 pandemic, which necessitated consideration that the patient might be at risk for infection with the SARS-CoV-2 virus that causes COVID-19. Institutional protocols and algorithms that pertain to the evaluation of patients at risk for COVID-19 are in a state of rapid change based on information released by regulatory bodies including the CDC and federal and state organizations. These policies and algorithms were followed  during the patient's care in the ED.  I reviewed the patient's prescription history over the last 12 months in the multi-state controlled substances database(s) that includes [place], [place], [place], [place], [place], [place], [place], [place], [place], [place], [place], [place], [place], and [place].  Results were notable for RX history noted.  ____________________________________________  FINAL CLINICAL IMPRESSION(S) / ED DIAGNOSES  Final diagnoses:  Syncope, unspecified syncope type  Acute pain of left shoulder      Karmen Stabs, Charlesetta Ivory, PA-C 02/02/20 2244    Sharman Cheek, MD 02/02/20 2246

## 2020-02-02 NOTE — ED Triage Notes (Signed)
First nurse note- interpretor requested

## 2020-05-09 ENCOUNTER — Ambulatory Visit: Payer: Self-pay | Attending: Oncology

## 2020-05-09 ENCOUNTER — Other Ambulatory Visit: Payer: Self-pay

## 2020-05-09 ENCOUNTER — Ambulatory Visit
Admission: RE | Admit: 2020-05-09 | Discharge: 2020-05-09 | Disposition: A | Discharge status: Home / Self Care | Payer: Self-pay | Source: Ambulatory Visit | Attending: Oncology | Admitting: Oncology

## 2020-05-09 DIAGNOSIS — Z0000 Encounter for general adult medical examination without abnormal findings: Secondary | ICD-10-CM | POA: Insufficient documentation

## 2020-05-09 NOTE — Progress Notes (Signed)
Patient pre-screened for BCCCP eligibility due to COVID 19 precautions. Two patient identifiers used for verification that I was speaking to correct patient.  Patient to Present directly to [facility] today for BCCCP screening mammogram.  °

## 2020-07-27 ENCOUNTER — Ambulatory Visit
Admission: RE | Admit: 2020-07-27 | Discharge: 2020-07-27 | Disposition: A | Discharge status: Home / Self Care | Payer: Self-pay | Source: Ambulatory Visit | Attending: Oncology | Admitting: Oncology

## 2020-07-27 ENCOUNTER — Other Ambulatory Visit: Payer: Self-pay

## 2020-07-27 DIAGNOSIS — Z0000 Encounter for general adult medical examination without abnormal findings: Secondary | ICD-10-CM | POA: Insufficient documentation

## 2020-08-09 NOTE — Progress Notes (Unsigned)
Letter mailed from [facility] to notify of normal mammogram results.  Patient to return in one year for annual screening.  Copy to [facility]. 

## 2021-04-24 ENCOUNTER — Other Ambulatory Visit: Payer: Self-pay | Admitting: Primary Care

## 2021-04-24 ENCOUNTER — Ambulatory Visit
Admission: RE | Admit: 2021-04-24 | Discharge: 2021-04-24 | Disposition: A | Discharge status: Home / Self Care | Payer: 59 | Attending: Primary Care | Admitting: Primary Care

## 2021-04-24 ENCOUNTER — Ambulatory Visit
Admission: RE | Admit: 2021-04-24 | Discharge: 2021-04-24 | Disposition: A | Discharge status: Home / Self Care | Payer: 59 | Source: Ambulatory Visit | Attending: Primary Care | Admitting: Primary Care

## 2021-04-24 DIAGNOSIS — M5442 Lumbago with sciatica, left side: Secondary | ICD-10-CM | POA: Insufficient documentation

## 2021-04-24 DIAGNOSIS — M5441 Lumbago with sciatica, right side: Secondary | ICD-10-CM | POA: Insufficient documentation

## 2021-08-02 ENCOUNTER — Emergency Department: Payer: 59

## 2021-08-02 ENCOUNTER — Emergency Department
Admission: EM | Admit: 2021-08-02 | Discharge: 2021-08-02 | Disposition: A | Discharge status: Home / Self Care | Payer: 59 | Attending: Emergency Medicine | Admitting: Emergency Medicine

## 2021-08-02 ENCOUNTER — Other Ambulatory Visit: Payer: Self-pay

## 2021-08-02 DIAGNOSIS — R110 Nausea: Secondary | ICD-10-CM | POA: Diagnosis not present

## 2021-08-02 DIAGNOSIS — R1013 Epigastric pain: Secondary | ICD-10-CM | POA: Insufficient documentation

## 2021-08-02 DIAGNOSIS — Z20822 Contact with and (suspected) exposure to covid-19: Secondary | ICD-10-CM | POA: Diagnosis not present

## 2021-08-02 DIAGNOSIS — K2970 Gastritis, unspecified, without bleeding: Secondary | ICD-10-CM

## 2021-08-02 DIAGNOSIS — I10 Essential (primary) hypertension: Secondary | ICD-10-CM | POA: Diagnosis not present

## 2021-08-02 LAB — CBC
HCT: 37.2 % (ref 36.0–46.0)
Hemoglobin: 12.4 g/dL (ref 12.0–15.0)
MCH: 30.4 pg (ref 26.0–34.0)
MCHC: 33.3 g/dL (ref 30.0–36.0)
MCV: 91.2 fL (ref 80.0–100.0)
Platelets: 198 10*3/uL (ref 150–400)
RBC: 4.08 MIL/uL (ref 3.87–5.11)
RDW: 12.9 % (ref 11.5–15.5)
WBC: 10 10*3/uL (ref 4.0–10.5)
nRBC: 0 % (ref 0.0–0.2)

## 2021-08-02 LAB — TROPONIN I (HIGH SENSITIVITY): Troponin I (High Sensitivity): 3 ng/L (ref <18)

## 2021-08-02 LAB — COMPREHENSIVE METABOLIC PANEL
ALT: 28 U/L (ref 0–44)
AST: 17 U/L (ref 15–41)
Albumin: 4.2 g/dL (ref 3.5–5.0)
Alkaline Phosphatase: 76 U/L (ref 38–126)
Anion gap: 6 (ref 5–15)
BUN: 18 mg/dL (ref 6–20)
CO2: 26 mmol/L (ref 22–32)
Calcium: 9.1 mg/dL (ref 8.9–10.3)
Chloride: 105 mmol/L (ref 98–111)
Creatinine, Ser: 0.55 mg/dL (ref 0.44–1.00)
GFR, Estimated: >60 mL/min (ref >60)
Glucose, Bld: 106 mg/dL — ABNORMAL HIGH (ref 70–99)
Potassium: 4 mmol/L (ref 3.5–5.1)
Sodium: 137 mmol/L (ref 135–145)
Total Bilirubin: 0.7 mg/dL (ref 0.3–1.2)
Total Protein: 7.8 g/dL (ref 6.5–8.1)

## 2021-08-02 LAB — URINALYSIS, ROUTINE W REFLEX MICROSCOPIC
Bilirubin Urine: NEGATIVE
Glucose, UA: NEGATIVE mg/dL
Hgb urine dipstick: NEGATIVE
Ketones, ur: NEGATIVE mg/dL
Leukocytes,Ua: NEGATIVE
Nitrite: NEGATIVE
Protein, ur: 100 mg/dL — AB
Specific Gravity, Urine: 1.025 (ref 1.005–1.030)
pH: 6 (ref 5.0–8.0)

## 2021-08-02 LAB — LIPASE, BLOOD: Lipase: 23 U/L (ref 11–51)

## 2021-08-02 LAB — RESP PANEL BY RT-PCR (FLU A&B, COVID) ARPGX2
Influenza A by PCR: NEGATIVE
Influenza B by PCR: NEGATIVE
SARS Coronavirus 2 by RT PCR: NEGATIVE

## 2021-08-02 LAB — POC URINE PREG, ED: Preg Test, Ur: NEGATIVE

## 2021-08-02 MED ORDER — ONDANSETRON HCL 4 MG/2ML IJ SOLN
4.0000 mg | Freq: Once | INTRAMUSCULAR | Status: AC
  Administered 2021-08-02: 14:00:00 4 mg via INTRAVENOUS
  Filled 2021-08-02: qty 2

## 2021-08-02 MED ORDER — ALUM & MAG HYDROXIDE-SIMETH 200-200-20 MG/5ML PO SUSP
30.0000 mL | Freq: Once | ORAL | Status: AC
  Administered 2021-08-02: 14:00:00 30 mL via ORAL
  Filled 2021-08-02: qty 30

## 2021-08-02 MED ORDER — ONDANSETRON 4 MG PO TBDP: 4.0000 mg | ORAL_TABLET | Freq: Three times a day (TID) | ORAL | 0 refills | Status: AC | PRN

## 2021-08-02 MED ORDER — IOHEXOL 300 MG/ML  SOLN
100.0000 mL | Freq: Once | INTRAMUSCULAR | Status: AC | PRN
  Administered 2021-08-02: 15:00:00 100 mL via INTRAVENOUS
  Filled 2021-08-02: qty 100

## 2021-08-02 MED ORDER — SUCRALFATE 1 G PO TABS: 1.0000 g | ORAL_TABLET | Freq: Three times a day (TID) | ORAL | 0 refills | Status: AC

## 2021-08-02 MED ORDER — LIDOCAINE VISCOUS HCL 2 % MT SOLN
15.0000 mL | Freq: Once | OROMUCOSAL | Status: AC
  Administered 2021-08-02: 14:00:00 15 mL via ORAL
  Filled 2021-08-02: qty 15

## 2021-08-02 MED ORDER — PANTOPRAZOLE SODIUM 20 MG PO TBEC: 20.0000 mg | DELAYED_RELEASE_TABLET | Freq: Every day | ORAL | 0 refills | Status: AC

## 2021-08-02 MED ORDER — PANTOPRAZOLE SODIUM 40 MG IV SOLR
40.0000 mg | Freq: Once | INTRAVENOUS | Status: AC
  Administered 2021-08-02: 14:00:00 40 mg via INTRAVENOUS
  Filled 2021-08-02: qty 40

## 2021-08-02 MED ORDER — ACETAMINOPHEN 500 MG PO TABS
1000.0000 mg | ORAL_TABLET | Freq: Once | ORAL | Status: AC
  Administered 2021-08-02: 14:00:00 1000 mg via ORAL
  Filled 2021-08-02: qty 2

## 2021-08-02 NOTE — Discharge Instructions (Addendum)
NyQuil can increase some reflux.  We have started you on some reflux medication.  Stop taking the NyQuil.  Call your GI doctor to get follow-up.  Return to the ER for fevers, worsening pain or any other concerns  Follow-up with your primary care doctor discussed the protein in your urine

## 2021-08-02 NOTE — ED Triage Notes (Signed)
Pt sent from [facility] with c/o upper abd pain with nausea since yesterday, spicy food makes it worse.

## 2021-08-02 NOTE — ED Provider Notes (Signed)
[facility] Emergency Department Provider Note  ____________________________________________   Event Date/Time   First MD Initiated Contact with Patient 08/02/21 1248     (approximate)  I have reviewed the triage vital signs and the nursing notes.   HISTORY  Chief Complaint Abdominal Pain    HPI Donna Villarreal is a 50 y.o. female with hypertension who comes in with upper abdominal pain and nausea.  Patient reports having symptoms started yesterday.  She does report history of acid reflux but is not on any medications for currently.  She denies any current shortness of breath or chest pain.  No leg swelling.  She is on progesterone for intermittent vaginal bleeding seen by OB/GYN.  She reports that she has been taking NyQuil recently just due to just not feeling well.  She reports that her pain is in the upper abdomen, constant, worse with spicy foods, nothing makes it better.  She also reports feeling like she has a lump in her upper abdomen as well on the left side.  She states that she has been seen before for and they said it probably is not can to be any issue but she states that she is having more pain on that side as well.     Spanish interpreter was used.    Past Medical History:  Diagnosis Date   Arthritis    Hypertension     Patient Active Problem List   Diagnosis Date Noted   Dyspepsia    Urinary urgency 12/10/2012   History of urinary stone 11/26/2012   Incontinence of urine 11/26/2012   Renal stone 11/26/2012    Past Surgical History:  Procedure Laterality Date   CESAREAN SECTION     ESOPHAGOGASTRODUODENOSCOPY (EGD) WITH PROPOFOL N/A 10/14/2017   Procedure: ESOPHAGOGASTRODUODENOSCOPY (EGD) WITH PROPOFOL;  Surgeon: Midge Minium, MD;  Location: [facility] ENDOSCOPY;  Service: Endoscopy;  Laterality: N/A;    Prior to Admission medications   Medication Sig Start Date End Date Taking? Authorizing Provider  acetaminophen (TYLENOL) 500  MG tablet Take 1,000 mg by mouth. 11/19/13   [provider]  albuterol (PROVENTIL HFA) 108 (90 Base) MCG/ACT inhaler 2 puffs q.i.d. p.r.n. short of breath, wheezing, or cough 06/20/17   [provider]  azelastine (OPTIVAR) 0.05 % ophthalmic solution  04/18/17   [provider]  cetirizine (ZYRTEC) 10 MG tablet Take 10 mg by mouth daily.    [provider]  cyclobenzaprine (FLEXERIL) 5 MG tablet Take 5 mg by mouth. 11/19/13   [provider]  DULoxetine (CYMBALTA) 30 MG capsule  08/26/17   [provider]  fexofenadine (ALLEGRA) 180 MG tablet Take 180 mg by mouth.    [provider]  fluticasone (FLONASE) 50 MCG/ACT nasal spray Place into the nose. 07/29/17   [provider]  Olopatadine HCl 0.2 % SOLN  09/23/17   [provider]  omeprazole (PRILOSEC) 20 MG capsule  08/26/17   [provider]  oxybutynin (DITROPAN-XL) 10 MG 24 hr tablet Take 10 mg by mouth. 12/10/12   [provider]  promethazine (PHENERGAN) 12.5 MG tablet Take 1 tablet (12.5 mg total) by mouth every 6 (six) hours as needed for nausea or vomiting. 10/29/15   Jeanmarie Plant, MD  ROBAFEN AC 100-10 MG/5ML syrup  07/29/17   [provider]    Allergies Patient has no known allergies.  Family History  Problem Relation Age of Onset   Breast cancer Neg Hx  Social History Social History   Tobacco Use   Smoking status: Never   Smokeless tobacco: Never  Vaping Use   Vaping Use: Never used  Substance Use Topics   Alcohol use: No   Drug use: No      Review of Systems Constitutional: No fever/chills Eyes: No visual changes. ENT: No sore throat. Cardiovascular: Denies chest pain. Respiratory: Denies shortness of breath. Gastrointestinal: Positive abdominal pain, nausea,  Genitourinary: Negative for dysuria. Musculoskeletal: Negative for back pain. Skin: Negative for rash. Neurological: Negative for headaches,  focal weakness or numbness. All other ROS negative ____________________________________________   PHYSICAL EXAM:  VITAL SIGNS: ED Triage Vitals  Enc Vitals Group     BP 08/02/21 1201 110/74     Pulse Rate 08/02/21 1201 80     Resp 08/02/21 1201 18     Temp 08/02/21 1201 98.3 F (36.8 C)     Temp Source 08/02/21 1201 Oral     SpO2 08/02/21 1201 96 %     Weight 08/02/21 1204 155 lb (70.3 kg)     Height 08/02/21 1204 5\' 4"  (1.626 m)     Head Circumference --      Peak Flow --      Pain Score 08/02/21 1204 8     Pain Loc --      Pain Edu? --      Excl. in GC? --     Constitutional: Alert and oriented. Well appearing and in no acute distress. Eyes: Conjunctivae are normal. EOMI. Head: Atraumatic. Nose: No congestion/rhinnorhea. Mouth/Throat: Mucous membranes are moist.   Neck: No stridor. Trachea Midline. FROM Cardiovascular: Normal rate, regular rhythm. Grossly normal heart sounds.  Good peripheral circulation. Respiratory: Normal respiratory effort.  No retractions. Lungs CTAB. Gastrointestinal: Soft with some reported epigastric tenderness without any rebound or guarding no distention. No abdominal bruits.  Musculoskeletal: No lower extremity tenderness nor edema.  No joint effusions. Neurologic:  Normal speech and language. No gross focal neurologic deficits are appreciated.  Skin:  Skin is warm, dry and intact. No rash noted. Psychiatric: Mood and affect are normal. Speech and behavior are normal. GU: Deferred   ____________________________________________   LABS (all labs ordered are listed, but only abnormal results are displayed)  Labs Reviewed  COMPREHENSIVE METABOLIC PANEL - Abnormal; Notable for the following components:      Result Value   Glucose, Bld 106 (*)    All other components within normal limits  URINALYSIS, ROUTINE W REFLEX MICROSCOPIC - Abnormal; Notable for the following components:   APPearance CLEAR (*)    Protein, ur 100 (*)    Bacteria, UA  RARE (*)    All other components within normal limits  LIPASE, BLOOD  CBC  POC URINE PREG, ED  TROPONIN I (HIGH SENSITIVITY)   ____________________________________________   ED ECG REPORT I, 08/04/21, the attending physician, personally viewed and interpreted this ECG.  Normal sinus rate of 83, no ST elevation, no T wave inversions, normal intervals ____________________________________________  RADIOLOGY   Official radiology report(s): Concha Se ABDOMEN LIMITED RUQ (LIVER/GB)  Result Date: 08/02/2021 CLINICAL DATA:  Epigastric abdominal pain. EXAM: ULTRASOUND ABDOMEN LIMITED RIGHT UPPER QUADRANT COMPARISON:  CT abdomen pelvis dated September 11, 2017. FINDINGS: Gallbladder: No gallstones or wall thickening visualized. No sonographic Murphy sign noted by sonographer. Common bile duct: Diameter: 4 mm, normal. Liver: No focal lesion identified. Diffusely increased in parenchymal echogenicity. Portal vein is patent on color Doppler imaging with normal direction of blood flow towards  the liver. Other: None. IMPRESSION: 1. No acute abnormality. 2. Hepatic steatosis. Electronically Signed   By: Obie Dredge M.D.   On: 08/02/2021 14:03    ____________________________________________   PROCEDURES  Procedure(s) performed (including Critical Care):  Procedures   ____________________________________________   INITIAL IMPRESSION / ASSESSMENT AND PLAN / ED COURSE  Donna Villarreal was evaluated in Emergency Department on 08/02/2021 for the symptoms described in the history of present illness. She was evaluated in the context of the global COVID-19 pandemic, which necessitated consideration that the patient might be at risk for infection with the SARS-CoV-2 virus that causes COVID-19. Institutional protocols and algorithms that pertain to the evaluation of patients at risk for COVID-19 are in a state of rapid change based on information released by regulatory bodies including the CDC  and federal and state organizations. These policies and algorithms were followed during the patient's care in the ED.    Patient comes in with upper abdominal pain.  Labs ordered evaluate for any pancreatitis, gallstone issues, EKG, cardiac markers evaluate for ACS.  We will get COVID, flu swab.  Will get ultrasound evaluate for gallstone pathology.  Ultrasound was negative.  On my exam I do not see any obvious mass but she seems pretty uncomfortable even after GI cocktail and Tylenol therefore will get CT scan just to make sure no (of perforation, mass or other acute pathology  Pregnancy test was negative.  Labs are reassuring with normal LFTs and normal lipase.  White count is normal no evidence of infection.  Her urine has some protein in it.  Unclear what this means.  She can follow with her primary care doctor for this given she is not hypertensive has normal kidney function and is not pregnant.  Patient will be given some symptomatic treatment and handed off pending reevaluation after CT imaging.  If negative I suspect treatment for gastritis and home with GI follow-up    ____________________________________________   FINAL CLINICAL IMPRESSION(S) / ED DIAGNOSES   Final diagnoses:  Epigastric abdominal pain      MEDICATIONS GIVEN DURING THIS VISIT:  Medications - No data to display   ED Discharge Orders     None        Note:  This document was prepared using Dragon voice recognition software and may include unintentional dictation errors.    Concha Se, MD 08/02/21 [phone]

## 2021-08-02 NOTE — ED Notes (Signed)
Orson Slick, Spanish interpreter, at bedside.

## 2022-07-19 ENCOUNTER — Encounter
Admission: RE | Disposition: A | Discharge status: Home / Self Care | Payer: Self-pay | Source: Ambulatory Visit | Attending: Gastroenterology

## 2022-07-19 ENCOUNTER — Ambulatory Visit: Payer: No Typology Code available for payment source | Admitting: Anesthesiology

## 2022-07-19 ENCOUNTER — Ambulatory Visit
Admission: RE | Admit: 2022-07-19 | Discharge: 2022-07-19 | Disposition: A | Discharge status: Home / Self Care | Payer: No Typology Code available for payment source | Source: Ambulatory Visit | Attending: Gastroenterology | Admitting: Gastroenterology

## 2022-07-19 ENCOUNTER — Encounter: Payer: Self-pay | Admitting: *Deleted

## 2022-07-19 DIAGNOSIS — K648 Other hemorrhoids: Secondary | ICD-10-CM | POA: Diagnosis not present

## 2022-07-19 DIAGNOSIS — Z800 Family history of malignant neoplasm of digestive organs: Secondary | ICD-10-CM | POA: Insufficient documentation

## 2022-07-19 DIAGNOSIS — K5900 Constipation, unspecified: Secondary | ICD-10-CM | POA: Diagnosis not present

## 2022-07-19 DIAGNOSIS — M1990 Unspecified osteoarthritis, unspecified site: Secondary | ICD-10-CM | POA: Insufficient documentation

## 2022-07-19 DIAGNOSIS — G8929 Other chronic pain: Secondary | ICD-10-CM | POA: Diagnosis not present

## 2022-07-19 DIAGNOSIS — K640 First degree hemorrhoids: Secondary | ICD-10-CM | POA: Diagnosis not present

## 2022-07-19 DIAGNOSIS — I10 Essential (primary) hypertension: Secondary | ICD-10-CM | POA: Diagnosis not present

## 2022-07-19 DIAGNOSIS — K219 Gastro-esophageal reflux disease without esophagitis: Secondary | ICD-10-CM | POA: Insufficient documentation

## 2022-07-19 DIAGNOSIS — K2970 Gastritis, unspecified, without bleeding: Secondary | ICD-10-CM | POA: Insufficient documentation

## 2022-07-19 HISTORY — PX: ESOPHAGOGASTRODUODENOSCOPY (EGD) WITH PROPOFOL: SHX5813

## 2022-07-19 HISTORY — PX: COLONOSCOPY WITH PROPOFOL: SHX5780

## 2022-07-19 LAB — POCT PREGNANCY, URINE: Preg Test, Ur: NEGATIVE

## 2022-07-19 SURGERY — ESOPHAGOGASTRODUODENOSCOPY (EGD) WITH PROPOFOL
Anesthesia: General

## 2022-07-19 MED ORDER — PROPOFOL 10 MG/ML IV BOLUS
INTRAVENOUS | Status: DC | PRN
  Administered 2022-07-19: 120 mg via INTRAVENOUS
  Administered 2022-07-19 (×6): 40 mg via INTRAVENOUS

## 2022-07-19 MED ORDER — SIMETHICONE 40 MG/0.6ML PO SUSP
ORAL | Status: DC | PRN
  Administered 2022-07-19: 340 mL

## 2022-07-19 MED ORDER — SODIUM CHLORIDE 0.9 % IV SOLN: INTRAVENOUS | Status: DC

## 2022-07-19 MED ORDER — LIDOCAINE HCL (CARDIAC) PF 100 MG/5ML IV SOSY
PREFILLED_SYRINGE | INTRAVENOUS | Status: DC | PRN
  Administered 2022-07-19: 100 mg via INTRAVENOUS

## 2022-07-19 NOTE — Interval H&P Note (Signed)
History and Physical Interval Note:  07/19/2022 12:38 PM  Donna Villarreal  has presented today for surgery, with the diagnosis of UPPER ABDOMEN,DYSPEPSIA,CONSTIPATION,RECTAL BLEEDING.  The various methods of treatment have been discussed with the patient and family. After consideration of risks, benefits and other options for treatment, the patient has consented to  Procedure(s) with comments: ESOPHAGOGASTRODUODENOSCOPY (EGD) WITH PROPOFOL (N/A) - SPANISH INTERPRETER COLONOSCOPY WITH PROPOFOL (N/A) as a surgical intervention.  The patient's history has been reviewed, patient examined, no change in status, stable for surgery.  I have reviewed the patient's chart and labs.  Questions were answered to the patient's satisfaction.     Regis Bill  Ok to proceed with EGD/Colonoscopy

## 2022-07-19 NOTE — H&P (Signed)
Outpatient short stay form Pre-procedure 07/19/2022  Regis Bill, MD  Primary Physician: [facility]  Reason for visit:  Abdominal pain  History of present illness:    51 y/o lady with history of hypertension here for EGD/Colonoscopy for abdominal pain. Had endoscopy many years ago. No blood thinners. Father had stomach cancer. No significant abdominal surgeries.    Current Facility-Administered Medications:    0.9 %  sodium chloride infusion, , Intravenous, Continuous, Saxon Barich, Rossie Muskrat, MD, Last Rate: 20 mL/hr at 07/19/22 1217, New Bag at 07/19/22 1217  Medications Prior to Admission  Medication Sig Dispense Refill Last Dose   acetaminophen (TYLENOL) 500 MG tablet Take 1,000 mg by mouth.   Past Month   albuterol (PROVENTIL HFA) 108 (90 Base) MCG/ACT inhaler 2 puffs q.i.d. p.r.n. short of breath, wheezing, or cough (Patient not taking: Reported on 07/19/2022)   Not Taking   azelastine (OPTIVAR) 0.05 % ophthalmic solution       cetirizine (ZYRTEC) 10 MG tablet Take 10 mg by mouth daily.      cyclobenzaprine (FLEXERIL) 5 MG tablet Take 5 mg by mouth.      DULoxetine (CYMBALTA) 30 MG capsule       fexofenadine (ALLEGRA) 180 MG tablet Take 180 mg by mouth.      fluticasone (FLONASE) 50 MCG/ACT nasal spray Place into the nose.      mirabegron ER (MYRBETRIQ) 25 MG TB24 tablet Take 1 tablet by mouth daily.      Olopatadine HCl 0.2 % SOLN       oxybutynin (DITROPAN-XL) 10 MG 24 hr tablet Take 10 mg by mouth.      pantoprazole (PROTONIX) 20 MG tablet Take 1 tablet (20 mg total) by mouth daily for 14 days. 14 tablet 0 07/17/2022   progesterone (PROMETRIUM) 100 MG capsule Take 100 mg by mouth at bedtime.      promethazine (PHENERGAN) 12.5 MG tablet Take 1 tablet (12.5 mg total) by mouth every 6 (six) hours as needed for nausea or vomiting. 10 tablet 0    ROBAFEN AC 100-10 MG/5ML syrup       solifenacin (VESICARE) 10 MG tablet Take 10 mg by mouth daily.    07/16/2022   sucralfate (CARAFATE) 1 g tablet Take 1 tablet (1 g total) by mouth 4 (four) times daily -  with meals and at bedtime for 14 days. 56 tablet 0      Allergies  Allergen Reactions   Pollen Extract      Past Medical History:  Diagnosis Date   Arthritis    Hypertension     Review of systems:  Otherwise negative.    Physical Exam  Gen: Alert, oriented. Appears stated age.  HEENT: PERRLA. Lungs: No respiratory distress CV: RRR Abd: soft, benign, no masses Ext: No edema    Planned procedures: Proceed with EGD/colonoscopy. The patient understands the nature of the planned procedure, indications, risks, alternatives and potential complications including but not limited to bleeding, infection, perforation, damage to internal organs and possible oversedation/side effects from anesthesia. The patient agrees and gives consent to proceed.  Please refer to procedure notes for findings, recommendations and patient disposition/instructions.     Regis Bill, MD [facility]

## 2022-07-19 NOTE — Anesthesia Preprocedure Evaluation (Addendum)
Anesthesia Evaluation  Patient identified by MRN, date of birth, ID band Patient awake    Reviewed: Allergy & Precautions, NPO status , Patient's Chart, lab work & pertinent test results  History of Anesthesia Complications Negative for: history of anesthetic complications  Airway Mallampati: IV   Neck ROM: Full    Dental  (+) Edentulous Upper, Edentulous Lower   Pulmonary neg pulmonary ROS   Pulmonary exam normal breath sounds clear to auscultation       Cardiovascular hypertension, Normal cardiovascular exam Rhythm:Regular Rate:Normal     Neuro/Psych Chronic pain    GI/Hepatic ,GERD  ,,  Endo/Other  negative endocrine ROS    Renal/GU negative Renal ROS     Musculoskeletal  (+) Arthritis ,    Abdominal   Peds  Hematology negative hematology ROS (+)   Anesthesia Other Findings   Reproductive/Obstetrics                             Anesthesia Physical Anesthesia Plan  ASA: 2  Anesthesia Plan: General   Post-op Pain Management:    Induction: Intravenous  PONV Risk Score and Plan: 3 and Propofol infusion, TIVA and Treatment may vary due to age or medical condition  Airway Management Planned: Natural Airway  Additional Equipment:   Intra-op Plan:   Post-operative Plan:   Informed Consent: I have reviewed the patients History and Physical, chart, labs and discussed the procedure including the risks, benefits and alternatives for the proposed anesthesia with the patient or authorized representative who has indicated his/her understanding and acceptance.       Plan Discussed with: CRNA  Anesthesia Plan Comments: (Spanish interpreter used for history and consent.  LMA/GETA backup discussed.  Patient consented for risks of anesthesia including but not limited to:  - adverse reactions to medications - damage to eyes, teeth, lips or other oral mucosa - nerve damage due to  positioning  - sore throat or hoarseness - damage to heart, brain, nerves, lungs, other parts of body or loss of life  Informed patient about role of CRNA in peri- and intra-operative care.  Patient voiced understanding.)       Anesthesia Quick Evaluation

## 2022-07-19 NOTE — Transfer of Care (Signed)
Immediate Anesthesia Transfer of Care Note  Patient: Donna Villarreal  Procedure(s) Performed: ESOPHAGOGASTRODUODENOSCOPY (EGD) WITH PROPOFOL COLONOSCOPY WITH PROPOFOL  Patient Location: Endoscopy Unit  Anesthesia Type:General  Level of Consciousness: drowsy  Airway & Oxygen Therapy: Patient Spontanous Breathing and Patient connected to nasal cannula oxygen  Post-op Assessment: Report given to RN, Post -op Vital signs reviewed and stable, and Patient moving all extremities  Post vital signs: Reviewed and stable  Last Vitals:  Vitals Value Taken Time  BP 103/64 07/19/22 1303  Temp    Pulse 78 07/19/22 1303  Resp 15 07/19/22 1303  SpO2 98 % 07/19/22 1303    Last Pain:  Vitals:   07/19/22 1303  TempSrc:   PainSc: Asleep         Complications: No notable events documented.

## 2022-07-19 NOTE — Op Note (Signed)
[facility] Patient Name: Donna Villarreal Procedure Date: 07/19/2022 12:21 PM MRN: [id number] Account #: [id number] Date of Birth: June 13, 1971 Admit Type: Outpatient Age: 51 Room: [facility] ENDO ROOM 3 Gender: Female Note Status: Finalized Instrument Name: Michaelle Birks [id number] Procedure:             Upper GI endoscopy Indications:           Upper abdominal pain, Dyspepsia Providers:             Andrey Farmer MD, MD Medicines:             Monitored Anesthesia Care Complications:         No immediate complications. Estimated blood loss:                         Minimal. Procedure:             Pre-Anesthesia Assessment:                        - Prior to the procedure, a History and Physical was                         performed, and patient medications and allergies were                         reviewed. The patient is competent. The risks and                         benefits of the procedure and the sedation options and                         risks were discussed with the patient. All questions                         were answered and informed consent was obtained.                         Patient identification and proposed procedure were                         verified by the physician, the nurse, the                         anesthesiologist, the anesthetist and the technician                         in the endoscopy suite. Mental Status Examination:                         alert and oriented. Airway Examination: normal                         oropharyngeal airway and neck mobility. Respiratory                         Examination: clear to auscultation. CV Examination:                         normal. Prophylactic Antibiotics: The patient does not  require prophylactic antibiotics. Prior                         Anticoagulants: The patient has taken no anticoagulant                         or antiplatelet agents. ASA  Grade Assessment: II - A                         patient with mild systemic disease. After reviewing                         the risks and benefits, the patient was deemed in                         satisfactory condition to undergo the procedure. The                         anesthesia plan was to use monitored anesthesia care                         (MAC). Immediately prior to administration of                         medications, the patient was re-assessed for adequacy                         to receive sedatives. The heart rate, respiratory                         rate, oxygen saturations, blood pressure, adequacy of                         pulmonary ventilation, and response to care were                         monitored throughout the procedure. The physical                         status of the patient was re-assessed after the                         procedure.                        After obtaining informed consent, the endoscope was                         passed under direct vision. Throughout the procedure,                         the patient's blood pressure, pulse, and oxygen                         saturations were monitored continuously. The Endoscope                         was introduced through the mouth, and advanced to the  second part of duodenum. The upper GI endoscopy was                         accomplished without difficulty. The patient tolerated                         the procedure well. Findings:      The examined esophagus was normal.      The entire examined stomach was normal. Biopsies were taken with a cold       forceps for Helicobacter pylori testing. Estimated blood loss was       minimal.      The examined duodenum was normal. Impression:            - Normal esophagus.                        - Normal stomach. Biopsied.                        - Normal examined duodenum. Recommendation:        - Await pathology results.                         - Perform a colonoscopy today. Procedure Code(s):     --- Professional ---                        [id number], Esophagogastroduodenoscopy, flexible,                         transoral; with biopsy, single or multiple Diagnosis Code(s):     --- Professional ---                        R10.10, Upper abdominal pain, unspecified                        R10.13, Epigastric pain CPT copyright 2022 American Medical Association. All rights reserved. The codes documented in this report are preliminary and upon coder review may  be revised to meet current compliance requirements. Andrey Farmer MD, MD 07/19/2022 1:04:20 PM Number of Addenda: 0 Note Initiated On: 07/19/2022 12:21 PM Estimated Blood Loss:  Estimated blood loss was minimal.      [facility]

## 2022-07-19 NOTE — Anesthesia Postprocedure Evaluation (Signed)
Anesthesia Post Note  Patient: Donna Villarreal  Procedure(s) Performed: ESOPHAGOGASTRODUODENOSCOPY (EGD) WITH PROPOFOL COLONOSCOPY WITH PROPOFOL  Patient location during evaluation: PACU Anesthesia Type: General Level of consciousness: awake and alert, oriented and patient cooperative Pain management: pain level controlled Vital Signs Assessment: post-procedure vital signs reviewed and stable Respiratory status: spontaneous breathing, nonlabored ventilation and respiratory function stable Cardiovascular status: blood pressure returned to baseline and stable Postop Assessment: adequate PO intake Anesthetic complications: no   No notable events documented.   Last Vitals:  Vitals:   07/19/22 1313 07/19/22 1323  BP: 106/73 (!) 145/73  Pulse: 65 79  Resp: 13 16  Temp:    SpO2: 100% 100%    Last Pain:  Vitals:   07/19/22 1323  TempSrc:   PainSc: 3                  Reed Breech

## 2022-07-19 NOTE — Op Note (Signed)
[facility] Patient Name: Donna Villarreal Procedure Date: 07/19/2022 12:21 PM MRN: [id number] Account #: [id number] Date of Birth: [birth year] Admit Type: Outpatient Age: 51 Room: [facility] ENDO ROOM 3 Gender: Female Note Status: Finalized Instrument Name: Jasper Riling [id number] Procedure:             Colonoscopy Indications:           Constipation Providers:             Andrey Farmer MD, MD Referring MD:          Bridget Hartshorn [facility], dr (Referring MD) Medicines:             Monitored Anesthesia Care Complications:         No immediate complications. Procedure:             Pre-Anesthesia Assessment:                        - Prior to the procedure, a History and Physical was                         performed, and patient medications and allergies were                         reviewed. The patient is competent. The risks and                         benefits of the procedure and the sedation options and                         risks were discussed with the patient. All questions                         were answered and informed consent was obtained.                         Patient identification and proposed procedure were                         verified by the physician, the nurse, the                         anesthesiologist, the anesthetist and the technician                         in the endoscopy suite. Mental Status Examination:                         alert and oriented. Airway Examination: normal                         oropharyngeal airway and neck mobility. Respiratory                         Examination: clear to auscultation. CV Examination:                         normal. Prophylactic Antibiotics: The patient does not  require prophylactic antibiotics. Prior                         Anticoagulants: The patient has taken no anticoagulant                         or antiplatelet agents. ASA Grade  Assessment: II - A                         patient with mild systemic disease. After reviewing                         the risks and benefits, the patient was deemed in                         satisfactory condition to undergo the procedure. The                         anesthesia plan was to use monitored anesthesia care                         (MAC). Immediately prior to administration of                         medications, the patient was re-assessed for adequacy                         to receive sedatives. The heart rate, respiratory                         rate, oxygen saturations, blood pressure, adequacy of                         pulmonary ventilation, and response to care were                         monitored throughout the procedure. The physical                         status of the patient was re-assessed after the                         procedure.                        After obtaining informed consent, the colonoscope was                         passed under direct vision. Throughout the procedure,                         the patient's blood pressure, pulse, and oxygen                         saturations were monitored continuously. The                         Colonoscope was introduced through the anus and  advanced to the the terminal ileum. The colonoscopy                         was performed without difficulty. The patient                         tolerated the procedure well. The quality of the bowel                         preparation was good. The terminal ileum, ileocecal                         valve, appendiceal orifice, and rectum were                         photographed. Findings:      The perianal and digital rectal examinations were normal.      The terminal ileum appeared normal.      Internal hemorrhoids were found during retroflexion. The hemorrhoids       were Grade I (internal hemorrhoids that do not prolapse).      The exam was  otherwise without abnormality on direct and retroflexion       views. Impression:            - The examined portion of the ileum was normal.                        - Internal hemorrhoids.                        - The examination was otherwise normal on direct and                         retroflexion views.                        - No specimens collected. Recommendation:        - Discharge patient to home.                        - Resume previous diet.                        - Continue present medications.                        - Repeat colonoscopy in 10 years for screening                         purposes.                        - Return to referring physician as previously                         scheduled. Procedure Code(s):     --- Professional ---                        [phone], Colonoscopy, flexible; diagnostic, including  collection of specimen(s) by brushing or washing, when                         performed (separate procedure) Diagnosis Code(s):     --- Professional ---                        K64.0, First degree hemorrhoids                        K59.00, Constipation, unspecified CPT copyright 2022 [employer]. All rights reserved. The codes documented in this report are preliminary and upon coder review may  be revised to meet current compliance requirements. Andrey Farmer MD, MD 07/19/2022 1:06:58 PM Number of Addenda: 0 Note Initiated On: 07/19/2022 12:21 PM Scope Withdrawal Time: 0 hours 7 minutes 37 seconds  Total Procedure Duration: 0 hours 10 minutes 54 seconds  Estimated Blood Loss:  Estimated blood loss: none.      [facility]

## 2022-07-22 ENCOUNTER — Encounter: Payer: Self-pay | Admitting: Gastroenterology

## 2022-07-24 LAB — SURGICAL PATHOLOGY

## 2022-10-22 ENCOUNTER — Other Ambulatory Visit: Payer: Self-pay | Admitting: Orthopedic Surgery

## 2022-10-22 DIAGNOSIS — M4807 Spinal stenosis, lumbosacral region: Secondary | ICD-10-CM

## 2022-10-26 ENCOUNTER — Ambulatory Visit
Admission: RE | Admit: 2022-10-26 | Discharge: 2022-10-26 | Disposition: A | Discharge status: Home / Self Care | Payer: No Typology Code available for payment source | Source: Ambulatory Visit | Attending: Orthopedic Surgery | Admitting: Orthopedic Surgery

## 2022-10-26 DIAGNOSIS — M4807 Spinal stenosis, lumbosacral region: Secondary | ICD-10-CM

## 2023-04-01 ENCOUNTER — Ambulatory Visit
Admission: RE | Admit: 2023-04-01 | Discharge: 2023-04-01 | Disposition: A | Discharge status: Home / Self Care | Payer: No Typology Code available for payment source | Source: Ambulatory Visit | Attending: Family Medicine | Admitting: Family Medicine

## 2023-04-01 ENCOUNTER — Other Ambulatory Visit: Payer: Self-pay | Admitting: Family Medicine

## 2023-04-01 DIAGNOSIS — J189 Pneumonia, unspecified organism: Secondary | ICD-10-CM | POA: Diagnosis present
